# Patient Record
Sex: Male | Born: 1975
Health system: Southern US, Community
[De-identification: ages and names within clinical notes are randomized; demographics above are authoritative.]

## PROBLEM LIST (undated history)

## (undated) DIAGNOSIS — T7840XA Allergy, unspecified, initial encounter: Secondary | ICD-10-CM

## (undated) DIAGNOSIS — C4361 Malignant melanoma of right upper limb, including shoulder: Secondary | ICD-10-CM

## (undated) DIAGNOSIS — K219 Gastro-esophageal reflux disease without esophagitis: Secondary | ICD-10-CM

## (undated) HISTORY — DX: Gastro-esophageal reflux disease without esophagitis: K21.9

## (undated) HISTORY — DX: Malignant melanoma of right upper limb, including shoulder: C43.61

## (undated) HISTORY — DX: Allergy, unspecified, initial encounter: T78.40XA

---

## 2009-07-24 ENCOUNTER — Ambulatory Visit: Payer: Self-pay | Admitting: Internal Medicine

## 2009-07-24 DIAGNOSIS — C439 Malignant melanoma of skin, unspecified: Secondary | ICD-10-CM | POA: Insufficient documentation

## 2009-07-24 DIAGNOSIS — R252 Cramp and spasm: Secondary | ICD-10-CM

## 2009-07-24 DIAGNOSIS — J309 Allergic rhinitis, unspecified: Secondary | ICD-10-CM | POA: Insufficient documentation

## 2009-07-24 DIAGNOSIS — M25569 Pain in unspecified knee: Secondary | ICD-10-CM

## 2009-07-24 DIAGNOSIS — K219 Gastro-esophageal reflux disease without esophagitis: Secondary | ICD-10-CM | POA: Insufficient documentation

## 2009-08-08 ENCOUNTER — Encounter: Payer: Self-pay | Admitting: Internal Medicine

## 2010-04-11 ENCOUNTER — Other Ambulatory Visit: Payer: Self-pay | Admitting: Endocrinology

## 2010-04-11 ENCOUNTER — Ambulatory Visit
Admission: RE | Admit: 2010-04-11 | Discharge: 2010-04-11 | Payer: Self-pay | Source: Home / Self Care | Attending: Endocrinology | Admitting: Endocrinology

## 2010-04-11 DIAGNOSIS — R61 Generalized hyperhidrosis: Secondary | ICD-10-CM | POA: Insufficient documentation

## 2010-04-11 DIAGNOSIS — N498 Inflammatory disorders of other specified male genital organs: Secondary | ICD-10-CM | POA: Insufficient documentation

## 2010-04-11 LAB — LIPID PANEL
Cholesterol: 177 mg/dL (ref 0–200)
HDL: 40.4 mg/dL (ref >39.00)
LDL Cholesterol: 112 mg/dL — ABNORMAL HIGH (ref 0–99)
Total CHOL/HDL Ratio: 4
Triglycerides: 125 mg/dL (ref 0.0–149.0)
VLDL: 25 mg/dL (ref 0.0–40.0)

## 2010-04-11 LAB — CBC WITH DIFFERENTIAL/PLATELET
Basophils Absolute: 0 10*3/uL (ref 0.0–0.1)
Basophils Relative: 0.5 % (ref 0.0–3.0)
Eosinophils Absolute: 0.1 10*3/uL (ref 0.0–0.7)
Eosinophils Relative: 1.4 % (ref 0.0–5.0)
HCT: 44.9 % (ref 39.0–52.0)
Hemoglobin: 15.8 g/dL (ref 13.0–17.0)
Lymphocytes Relative: 20.6 % (ref 12.0–46.0)
Lymphs Abs: 1.2 10*3/uL (ref 0.7–4.0)
MCHC: 35.1 g/dL (ref 30.0–36.0)
MCV: 89.6 fl (ref 78.0–100.0)
Monocytes Absolute: 0.6 10*3/uL (ref 0.1–1.0)
Monocytes Relative: 9.6 % (ref 3.0–12.0)
Neutro Abs: 4 10*3/uL (ref 1.4–7.7)
Neutrophils Relative %: 67.9 % (ref 43.0–77.0)
Platelets: 321 10*3/uL (ref 150.0–400.0)
RBC: 5.01 Mil/uL (ref 4.22–5.81)
RDW: 13.1 % (ref 11.5–14.6)
WBC: 5.9 10*3/uL (ref 4.5–10.5)

## 2010-04-11 LAB — BASIC METABOLIC PANEL
BUN: 15 mg/dL (ref 6–23)
CO2: 29 mEq/L (ref 19–32)
Calcium: 9.6 mg/dL (ref 8.4–10.5)
Chloride: 102 mEq/L (ref 96–112)
Creatinine, Ser: 0.9 mg/dL (ref 0.4–1.5)
GFR: 106.56 mL/min (ref >60.00)
Glucose, Bld: 77 mg/dL (ref 70–99)
Potassium: 4.3 mEq/L (ref 3.5–5.1)
Sodium: 137 mEq/L (ref 135–145)

## 2010-04-11 LAB — HEPATIC FUNCTION PANEL
ALT: 35 U/L (ref 0–53)
AST: 26 U/L (ref 0–37)
Albumin: 4 g/dL (ref 3.5–5.2)
Alkaline Phosphatase: 64 U/L (ref 39–117)
Bilirubin, Direct: 0 mg/dL (ref 0.0–0.3)
Total Bilirubin: 0.8 mg/dL (ref 0.3–1.2)
Total Protein: 7.4 g/dL (ref 6.0–8.3)

## 2010-04-11 LAB — TSH: TSH: 1.52 u[IU]/mL (ref 0.35–5.50)

## 2010-04-15 ENCOUNTER — Encounter
Admission: RE | Admit: 2010-04-15 | Discharge: 2010-04-15 | Payer: Self-pay | Source: Home / Self Care | Attending: Endocrinology | Admitting: Endocrinology

## 2010-04-30 NOTE — Assessment & Plan Note (Signed)
Summary: NEW / [facility] Natale Milch  #   Vital Signs:  Patient profile:   35 year old male Height:      68 inches Weight:      181.50 pounds BMI:     27.70 O2 Sat:      97 % on Room air Temp:     98.3 degrees F oral Pulse rate:   81 / minute BP sitting:   108 / 74  (left arm) Cuff size:   regular  Vitals Entered ByZella Ball Ewing (July 24, 2009 10:21 AM)  O2 Flow:  Room air CC: New Pt. New [employer]/RE   CC:  New Pt. New [employer]/RE.  History of Present Illness: here with pain to the upper abd area, intermittnet for several years, used to be worse with putting down flooring , and more recetnly with putting the child in the car seat - has marked cramp  - better with sitting up very straight ad seems to help the cramp dissipate;  has some mild reflux in the past but this is different, no n/v, bowel change, blood, fever,.   also due for melanoma f/u  also here with right knee pain still persistent after playing football in Feb 2011 where a player jumpedon his back to tackle him and he suffered a swollen painful right knee;  not sure if he twisted it;  has had peristent pain since then although improving, but still wtih signficant mild decreased ROM on full extension and can no longer take 2 steps at a time going up stairs due to pain;  no falls or other injury, no fever, wt loss, or hx of knee problem in the past.    Preventive Screening-Counseling & Management  Alcohol-Tobacco     Smoking Status: quit      Drug Use:  no.    Problems Prior to Update: 1)  Knee Pain, Right  (ICD-719.46) 2)  Muscle Cramps  (ICD-729.82) 3)  Melanoma  (ICD-172.9) 4)  Gerd  (ICD-530.81) 5)  Allergic Rhinitis  (ICD-477.9)  Medications Prior to Update: 1)  None  Current Medications (verified): 1)  Fexofenadine Hcl 180 Mg Tabs (Fexofenadine Hcl) .Marland Kitchen.. 1 By Mouth Once Daily As Needed  Allergies (verified): No Known Drug Allergies  Past History:  Family History: Last updated: 07/24/2009 father with COPD,  HTN m-cousin and p-grandfather died with melanoma  Social History: Last updated: 07/24/2009 Married 1 child work - Holiday representative - [employer] Former Smoker Alcohol use-yes Drug use-no  Risk Factors: Smoking Status: quit (07/24/2009)  Past Medical History: Allergic rhinitis GERD hx of melanoma right arm 2010 - Dr Edmon Crape  Past Surgical History: Denies surgical history  Family History: Reviewed history and no changes required. father with COPD, HTN m-cousin and p-grandfather died with melanoma  Social History: Reviewed history and no changes required. Married 1 child work - Aeronautical engineer Former Smoker Alcohol use-yes Drug use-no Smoking Status:  quit Drug Use:  no  Review of Systems  The patient denies anorexia, fever, weight loss, weight gain, vision loss, decreased hearing, hoarseness, chest pain, syncope, dyspnea on exertion, peripheral edema, prolonged cough, headaches, hemoptysis, abdominal pain, melena, hematochezia, severe indigestion/heartburn, hematuria, muscle weakness, difficulty walking, depression, unusual weight change, abnormal bleeding, enlarged lymph nodes, and angioedema.         all otherwise negative per pt -    Physical Exam  General:  alert and well-developed.   Head:  normocephalic and atraumatic.   Eyes:  vision grossly  intact, pupils equal, and pupils round.   Ears:  R ear normal and L ear normal.   Nose:  no external deformity and no nasal discharge.   Mouth:  no gingival abnormalities and pharynx pink and moist.   Neck:  supple and no masses.   Lungs:  normal respiratory effort and normal breath sounds.   Heart:  normal rate and regular rhythm.   Abdomen:  soft, non-tender, and normal bowel sounds.   Msk:  no joint tenderness and no joint swelling.  , right knee without crepitus or effusion but slihgt decreased full extension, no pain on extension Extremities:  no edema, no erythema  Neurologic:  cranial nerves II-XII intact  and strength normal in all extremities.     Impression & Recommendations:  Problem # 1:  MUSCLE CRAMPS (ICD-729.82) to upper abd  only - due to exertion, MSK etiology - exam today benign, d/w pt - educated, reassured, ok to follow for now, pt declines labs such as ca, mg, K today  Problem # 2:  MELANOMA (ICD-172.9) exam benign today but needs yearly f/u - to refer to derm Orders: Dermatology Referral (Derma)  Problem # 3:  ALLERGIC RHINITIS (ICD-477.9)  His updated medication list for this problem includes:    Fexofenadine Hcl 180 Mg Tabs (Fexofenadine hcl) .Marland Kitchen... 1 by mouth once daily as needed treat as above, f/u any worsening signs or symptoms , hopefully more effective than claritin;  declines flonase at this time  Problem # 4:  KNEE PAIN, RIGHT (ICD-719.46) mild subjective, exam shows decreased ROM and decreased mobility - to refer to ortho for further eval - ? ACL strain improving, ? needs PT Orders: Orthopedic Surgeon Referral (Ortho Surgeon)  Complete Medication List: 1)  Fexofenadine Hcl 180 Mg Tabs (Fexofenadine hcl) .Marland Kitchen.. 1 by mouth once daily as needed  Patient Instructions: 1)  You will be contacted about the referral(s) to: dermatologist , and the orthopedic 2)  stop the claritin 3)  start the generic allegra instead 4)  call if you feel you need the generic flonase nasal spary as well 5)  Please schedule a follow-up appointment as needed. Prescriptions: FEXOFENADINE HCL 180 MG TABS (FEXOFENADINE HCL) 1 by mouth once daily as needed  #30 x 11   Entered and Authorized by:   Corwin Levins MD   Signed by:   Corwin Levins MD on 07/24/2009   Method used:   Print then Give to Patient   RxID:   [id number]

## 2010-04-30 NOTE — Letter (Signed)
Summary: [facility]  [facility]   Imported By: Sherian Rein 08/24/2009 12:11:26  _____________________________________________________________________  External Attachment:    Type:   Image     Comment:   External Document

## 2010-05-02 NOTE — Assessment & Plan Note (Signed)
Summary: LUMP ON TESTICLE/NWS   Vital Signs:  Patient profile:   35 year old male Height:      68 inches (172.72 cm) Weight:      179.75 pounds (81.70 kg) BMI:     27.43 O2 Sat:      97 % on Room air Temp:     98.5 degrees F (36.94 degrees C) oral Pulse rate:   68 / minute Pulse rhythm:   regular BP sitting:   118 / 82  (left arm) Cuff size:   regular  Vitals Entered By: Brenton Grills CMA Duncan Dull) (April 11, 2010 9:02 AM)  O2 Flow:  Room air CC: Lump on L testicle/aj Is Patient Diabetic? No Comments Pt is no longer taking Fexofenadine   CC:  Lump on L testicle/aj.  History of Present Illness: 2 days od slight nodule at the left testicle.  no assoc pain.  no urologic hx. pt states few weeks of night sweats.    Current Medications (verified): 1)  Fexofenadine Hcl 180 Mg Tabs (Fexofenadine Hcl) .Marland Kitchen.. 1 By Mouth Once Daily As Needed  Allergies (verified): No Known Drug Allergies  Past History:  Past Medical History: Last updated: 07/24/2009 Allergic rhinitis GERD hx of melanoma right arm 2010 - Dr Raliegh Ip Derm  Review of Systems  The patient denies fever, weight loss, and weight gain.         denies dysuria  Physical Exam  General:  Well developed, well nourished, in no acute distress.  Genitalia:  Normal external male genitalia, except for 2 mm firm nodule at the left testicle, with no urethral discharge.  Additional Exam:  FastTSH                   1.52 uIU/mL                 0.35-5.50    White Cell Count          5.9 K/uL                    4.5-10.5   Red Cell Count            5.01 Mil/uL                 4.22-5.81   Hemoglobin                15.8 g/dL                   29.5-18.8   Hematocrit                44.9 %                      39.0-52.0   MCV                       89.6 fl                     78.0-100.0   MCHC                      35.1 g/dL                   41.6-60.6   RDW                       13.1 %  11.5-14.6  Platelet Count            321.0 K/uL      LDL Cholesterol      [H]  161 mg/dL      Impression & Recommendations:  Problem # 1:  OTHER INFLAMMATORY DISORDER MALE GENITAL ORGANS (ICD-608.4) Assessment New uncertain etiology  Problem # 2:  NIGHT SWEATS (ICD-780.8) uncertain etiology  Problem # 3:  dyslipidemia mild  Other Orders: Radiology Referral (Radiology) TLB-TSH (Thyroid Stimulating Hormone) (84443-TSH) TLB-CBC Platelet - w/Differential (85025-CBCD) TLB-Lipid Panel (80061-LIPID) TLB-BMP (Basic Metabolic Panel-BMET) (80048-METABOL) TLB-Hepatic/Liver Function Pnl (80076-HEPATIC) Est. Patient Level IV (09604)  Patient Instructions: 1)  check ultrasound of the left testicle.  you will be called with a day and time for an appointment.  then please call [phone] to hear your test results. 2)  blood tests are being ordered for you today.  again, please call [phone] to hear your test results. 3)  (update: i left message on phone-tree:  rx as we discussed.  some drs advise chol med for this ldl)   Orders Added: 1)  Radiology Referral [Radiology] 2)  TLB-TSH (Thyroid Stimulating Hormone) [84443-TSH] 3)  TLB-CBC Platelet - w/Differential [85025-CBCD] 4)  TLB-Lipid Panel [80061-LIPID] 5)  TLB-BMP (Basic Metabolic Panel-BMET) [80048-METABOL] 6)  TLB-Hepatic/Liver Function Pnl [80076-HEPATIC] 7)  Est. Patient Level IV [56213]

## 2014-01-02 ENCOUNTER — Ambulatory Visit (INDEPENDENT_AMBULATORY_CARE_PROVIDER_SITE_OTHER): Payer: 59 | Admitting: Family

## 2014-01-02 ENCOUNTER — Encounter: Payer: Self-pay | Admitting: Family

## 2014-01-02 VITALS — BP 130/90 | HR 72 | Temp 98.4°F | Ht 68.0 in | Wt 172.4 lb

## 2014-01-02 DIAGNOSIS — J309 Allergic rhinitis, unspecified: Secondary | ICD-10-CM

## 2014-01-02 DIAGNOSIS — K219 Gastro-esophageal reflux disease without esophagitis: Secondary | ICD-10-CM

## 2014-01-02 NOTE — Progress Notes (Signed)
Subjective:    Patient ID: Taylor Diaz, male    DOB: 1976-02-20, 38 y.o.   MRN: [id number]  HPI:  Taylor Diaz is a 38 y.o. male who presents today for to establish care. He was previously followed by a physician in the [place] office.  1) Heartburn - feeling it since last week which has been intermittent; Felt mainly in RUQ after a big meal or when constipated. Improves with bowel movements. Described as sharp pains like a "charlie horse"; denies coughs at night or burning sensation. Relieved by TUMS.    2) Left wrist - last Thursday night. Noted point on dorsal aspect of left wrist. Indicates it feels sharp. Denies trauma, tenderness, deformity or discoloration. Concerned with previous history of melanoma.   3) Rhinitis - denies fevers, chills, nightsweats. Wife previously had the flu. Indicates that he was sick for about a month with a cough, which has since resolved. Continues to have stuffiness every now and then.    Health Maintenance: Dental -- up to date Vision -- never - going to seek independently, will refer if needed.  Immunizations -- offered and declined flu shot. Believes last Tetanus was in 2010. Is going to check on this to update.   No Known Allergies Family History  Problem Relation Age of Onset  . COPD Father   . Hypertension Father    History   Social History  . Marital Status: Married    Spouse Name: Janett Billow    Number of Children: 2  . Years of Education: 12   Occupational History  .  [employer]   Social History Main Topics  . Smoking status: Former Smoker -- 1.00 packs/day for 5 years    Types: Cigarettes    Quit date: 01/02/2006  . Smokeless tobacco: Never Used  . Alcohol Use: Yes     Comment: A couple of drinks per week  . Drug Use: No  . Sexual Activity: Yes    Partners: Female   Other Topics Concern  . Not on file   Social History Narrative   [place], [place] moved when he was 73   Married - 2 daugter 5, son 3   Works at [employer] to [employer].    Christian   Review of Systems  General: Denies fever, chills, fatigue, or significant weight gain/loss. Skin: Denies rashes, lumps, itching, dryness, color changes, or hair/nail changes HENT:  Head: Denies headache or neck pain  Ears: Denies changes in hearing, ringing in ears, earache, drainage  Eyes: Denies loss/changes in vision, pain, redness, blurry/double vision, flashing  lights  Nose:Positive for occasional congestion.  Denies discharge, itching, nosebleed, sinus pain  Throat: Denies sore throat, hoarseness, dry mouth, sores, thrush Neck: Denies lumps, swollen glands, stiffness Breasts: Denies lumps, pain, discharge Respiratory: Denies shortness of breath, cough, sputum production, wheezing Cardiovascular: Denies chest pain/discomfort, tightness, palpitations, shortness of breath with activity, difficulty lying down, swelling, sudden awakening with shortness of breath Gastrointestinal: Positive for heartburn and charlie horse feeling as described in HPI. Denies dysphasia, change in appetite, nausea, change in bowel habits, rectal bleeding, constipation, diarrhea, yellow skin or eyes Urinary: Denies frequency, urgency, burning/pain, blood in urine, incontinence, change in urinary strength. Vascular: Denies calf pain with walking and leg cramping. Musculoskeletal: Positive for left wrist as described in HPI. Denies muscle/joint pain, stiffness, back pain, redness or swelling of joints, trauma Neurological: Denies dizziness, fainting, seizures, weakness, numbness, tingling, tremor  Hematologic - Denies ease of bruising or bleeding Endocrine - Denies heat or cold intolerance, sweating, frequent urination, excessive thirst, changes in appetite Psychiatric - Denies nervousness, stress, depression or memory loss.      Objective:     BP 130/90  Pulse 72  Temp(Src) 98.4 F (36.9 C) (Oral)  Ht 5\' 8"  (1.727 m)  Wt  172 lb 6.4 oz (78.2 kg)  BMI 26.22 kg/m2  SpO2 97% Nursing note and vital signs reviewed.  Physical Exam  Constitutional: He is oriented to person, place, and time. He appears well-developed and well-nourished. No distress.  HENT:  Head: Normocephalic.  Neck: Neck supple.  Cardiovascular: Normal rate, regular rhythm, normal heart sounds and intact distal pulses.   Pulmonary/Chest: Effort normal and breath sounds normal.  Abdominal: He exhibits no distension and no mass. There is no rebound and no guarding.  Flat, bowel sounds present in all quadrants. Percussion is appropriate. Mild discomfort RUQ. Liver span is appropriate, spleen not palpable.   Musculoskeletal:  Left wrist with no edema, deformity or discoloration. Full range of motion is present bilaterally.  Neurological: He is alert and oriented to person, place, and time.  Skin: Skin is warm and dry.  Psychiatric: He has a normal mood and affect. His behavior is normal. Judgment and thought content normal.      Assessment & Plan:

## 2014-01-02 NOTE — Assessment & Plan Note (Signed)
Currently stable. Be sure to drink plenty of fluids. Consider OTC antihistamine like Zyrtec or Allegra. Start Flonase to help with inflammation.

## 2014-01-02 NOTE — Progress Notes (Signed)
Pre visit review using our clinic review tool, if applicable. No additional management support is needed unless otherwise documented below in the visit note. 

## 2014-01-02 NOTE — Patient Instructions (Signed)
Thank you for choosing [facility].  Summary/Instructions:   Please keep a log of your stomach pain until your physical. Try a probiotic like Activia. May also try Prilosec or Zantac as needed.  For you nasal congestion recommend plenty of fluids, and OTC antihistamine as needed, and consider starting Flonase.  Please schedule an appointment in about a month for your physical and blood work.   Thank you for enrolling in [place]. Please follow the instructions below to securely access your online medical record. MyChart allows you to send messages to your doctor, view your test results, renew your prescriptions, schedule appointments, and more.  How Do I Sign Up? 1. In your Internet browser, go to [email or web address]. 2. Click on the New  User? link in the Sign In box.  3. Enter your MyChart Access Code exactly as it appears below. You will not need to use this code after you have completed the sign-up process. If you do not sign up before the expiration date, you must request a new code. MyChart Access Code: [email or web address] Expires: 03/03/2014 11:01 AM  4. Enter the last four digits of your Social Security Number (xxxx) and Date of Birth (mm/dd/yyyy) as indicated and click Next. You will be taken to the next sign-up page. 5. Create a MyChart ID. This will be your MyChart login ID and cannot be changed, so think of one that is secure and easy to remember. 6. Create a MyChart password. You can change your password at any time. 7. Enter your Password Reset Question and Answer and click Next. This can be used at a later time if you forget your password.  8. Select your communication preference, and if applicable enter your e-mail address. You will receive e-mail notification when new information is available in MyChart by choosing to receive e-mail notifications and filling in your e-mail. 9. Click Sign In. You can now view your medical record.   Additional  Information If you have questions, you can email [email or web address] or call [phone] to talk to our [facility] staff. Remember, MyChart is NOT to be used for urgent needs. For medical emergencies, dial 911.

## 2014-01-02 NOTE — Assessment & Plan Note (Addendum)
Continue TUMS as needed. Start probiotic and an OTC Zantac or Nexium. Keep log of the occurences. If no improvement will obtain imaging or refer to GI.

## 2014-10-31 ENCOUNTER — Ambulatory Visit: Payer: Self-pay

## 2014-11-08 ENCOUNTER — Encounter: Payer: Self-pay | Admitting: Physical Therapy

## 2014-11-08 ENCOUNTER — Ambulatory Visit: Payer: 59 | Attending: Orthopedic Surgery | Admitting: Physical Therapy

## 2014-11-08 DIAGNOSIS — M62830 Muscle spasm of back: Secondary | ICD-10-CM | POA: Diagnosis present

## 2014-11-08 DIAGNOSIS — M5417 Radiculopathy, lumbosacral region: Secondary | ICD-10-CM | POA: Insufficient documentation

## 2014-11-08 DIAGNOSIS — M5416 Radiculopathy, lumbar region: Secondary | ICD-10-CM

## 2014-11-09 NOTE — Therapy (Signed)
[facility] [address], [place], [address] Phone: [phone]   Fax:  [phone]  Physical Therapy Evaluation  Patient Details  Name: Taylor Diaz MRN: [id number] Date of Birth: 1976-02-03 Referring Provider:  Reche Dixon, PA-C  Encounter Date: 11/08/2014      PT End of Session - 11/08/14 1130    Visit Number 1   Number of Visits 8   Date for PT Re-Evaluation 12/07/14   PT Start Time 1020   PT Stop Time 1125   PT Time Calculation (min) 65 min   Activity Tolerance Patient tolerated treatment well   Behavior During Therapy [facility] for tasks assessed/performed      Past Medical History  Diagnosis Date  . Allergy   . Malignant melanoma of right forearm     Followed by Dermaotology - Dx 05/2008  . GERD (gastroesophageal reflux disease)     History reviewed. No pertinent past surgical history.  There were no vitals filed for this visit.  Visit Diagnosis:  Lumbar back pain with radiculopathy affecting left lower extremity - Plan: PT plan of care cert/re-cert  Spasm of muscle, back - Plan: PT plan of care cert/re-cert      Subjective Assessment - 11/08/14 1036    Subjective Patient reports he is having pain in lower back left>right side that comes and goes. He has radiation down left LE to outside of foot (L5-S1 distribution)   Pertinent History Patient reports he began having this episode of back pain since 06/2014: He was doing yard work and felt a pop and initially the pain was in his back and then began to radiate down leg.    Patient Stated Goals Patient would like to be able to have no pain and be able to return to prior level of activity without difficulty and pain: mowing yard with push mower, helping with carrying items up/down 3 flights of stairs, playing with children (43 and 39 years old)             [facility] PT Assessment - 11/08/14 2254    Assessment   Medical Diagnosis lumbar  radiculopathy left   Onset Date/Surgical Date 07/30/14   Hand Dominance Right   Next MD Visit unknown   Prior Therapy none   Precautions   Precautions None   Restrictions   Weight Bearing Restrictions No   Balance Screen   Has the patient fallen in the past 6 months No   Has the patient had a decrease in activity level because of a fear of falling?  No   Is the patient reluctant to leave their home because of a fear of falling?  No      Objective: AROM: lumbar spine: WFL's for flexion with increased lower back strain felt across both sides, limited in hamstring flexibility, lumbar extension: WFL's with improved pain level and less strain felt in lower back with repetition, lateral flexion equal bilaterally without increased pain.  Flexibility: hamstring flexibility decreased right and left (SLR to 70 degrees) AROM and strength: both LE's hip flexion WNL's, knee extension/flexion WNL's and ankle DF/EHL Special testing: FABERS negative both hips, SIJ compression negative for reproduction of symptoms, SLR negative bilaterally, crossed SLR negative bilaterally, slump testing mildy (+) left LE with pressure and pain reproduced in left lower back into hip/buttock Palpation: + spasms palpable along lower back left side L3-5 region Gait: WNL's Treatment:  Instructed in prone progression over one pillow, ice pack applied to  lower back x 10 min. During prone lying, prop on elbows x 2-3 min. Then standing back extension performed x 5 reps. Instructed in proper body mechanics for daily common activities, instructed in side lying clam exercise and performed with verbal cuing       PT Education - 11/08/14 1120    Education provided Yes   Education Details Educated patient in proper positioning for sitting with lumbar roll and good sitting posture, common activities body mechanics, lifing with correct techniques including golfer's bend. Exercises for home for positioning prone progression and  standing extension and side lying clam    Person(s) Educated Patient   Methods Explanation;Demonstration;Verbal cues;Handout   Comprehension Verbalized understanding;Returned demonstration;Verbal cues required             PT Long Term Goals - 11/08/14 1130    PT LONG TERM GOAL #1   Title Patient will demonstrate improve pain level and function with less difficulty with modified oswestry score of 20% or less by 12/07/2014   Baseline 34%    Status New   PT LONG TERM GOAL #2   Title Patient will demonstrate good knowledge of proper body mechanics for sitting, getting in and out of bed, car and proper lifting technique to decrease strain on back without verbal cuing by 12/07/14   Baseline patient has no knowledge of appropriate body mechanics for sitting, standing and moving or lifting   Status New   PT LONG TERM GOAL #3   Title Patient will be independent with self management of pain and progressive exercise to imrpove core control/strength for return to prior level of function without cuing by 12/07/14   Status New           Plan - 11/08/14 1130    Clinical Impression Statement Patient is a 39 year old male who presents with lumbar pain with raduculopathy that began 06/2014. He responded well with prone progression and verbalized understanding of home program for pain control and exercise. He has limited mobility in hamstrings, (+) spasms in lower back and decreased posture awareness and will benefit from physical therapy intervention to control pain, reduce spasms in order to return to prior level of function without pain.    Pt will benefit from skilled therapeutic intervention in order to improve on the following deficits Pain;Decreased range of motion;Impaired flexibility;Increased muscle spasms   Rehab Potential Good   PT Frequency 2x / week   PT Duration 4 weeks   PT Treatment/Interventions Manual techniques;Electrical Stimulation;Therapeutic exercise;Moist  Heat;Ultrasound;Patient/family education;Dry needling;Cryotherapy   PT Next Visit Plan prone progression, manual techniques STM, exercise progression, core control exercises   PT Home Exercise Plan improve body mechanics for daily activities, prone progression exercises, side lying clam exercise, use of ice    Consulted and Agree with Plan of Care Patient         Problem List Patient Active Problem List   Diagnosis Date Noted  . 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 NIGHT SWEATS 04/11/2010  . MELANOMA 07/24/2009  . ALLERGIC RHINITIS 07/24/2009  . GERD 07/24/2009  . KNEE PAIN, RIGHT 07/24/2009  . MUSCLE CRAMPS 07/24/2009    Jomarie Longs PT 11/09/2014, 2:32 PM   [facility] [address] [place], [address] Phone: [phone]   Fax:  [phone]

## 2014-11-10 ENCOUNTER — Ambulatory Visit: Payer: 59 | Admitting: Physical Therapy

## 2014-11-10 ENCOUNTER — Encounter: Payer: Self-pay | Admitting: Physical Therapy

## 2014-11-10 DIAGNOSIS — M5417 Radiculopathy, lumbosacral region: Secondary | ICD-10-CM | POA: Diagnosis not present

## 2014-11-10 DIAGNOSIS — M5416 Radiculopathy, lumbar region: Secondary | ICD-10-CM

## 2014-11-10 DIAGNOSIS — M62830 Muscle spasm of back: Secondary | ICD-10-CM

## 2014-11-10 NOTE — Therapy (Signed)
[facility] [address], [place], [address] Phone: [phone]   Fax:  [phone]  Physical Therapy Treatment  Patient Details  Name: Taylor Diaz MRN: [id number] Date of Birth: Aug 18, 1975 Referring Provider:  Reche Dixon, PA-C  Encounter Date: 11/10/2014      PT End of Session - 11/10/14 1020    Visit Number 2   Number of Visits 8   Date for PT Re-Evaluation 12/07/14   PT Start Time 0920   PT Stop Time 1020   PT Time Calculation (min) 60 min   Activity Tolerance Patient tolerated treatment well   Behavior During Therapy [facility] for tasks assessed/performed      Past Medical History  Diagnosis Date  . Allergy   . Malignant melanoma of right forearm     Followed by Dermaotology - Dx 05/2008  . GERD (gastroesophageal reflux disease)     History reviewed. No pertinent past surgical history.  There were no vitals filed for this visit.  Visit Diagnosis:  Lumbar back pain with radiculopathy affecting left lower extremity  Spasm of muscle, back      Subjective Assessment - 11/10/14 0919    Subjective Patient reports he has not had any foot symptoms for a couple of days and he is still having dull aching with pressure like feeling in his lower back .    Patient Stated Goals Patient would like to be able to have no pain and be able to return to prior level of activity without difficulty and pain.       Objective: Reassessed Slump test: mild + on left, decreased from previous session per patient report Palpation: left side lumbar spine and L4-5 over spine with spasms and point tenderness PA mobility decreased throughout lumbar spine with tenderness over L3-5 region with PA pressure        [facility] Adult PT Treatment/Exercise - 11/10/14 1016    Exercises   Exercises Other Exercises   Other Exercises  Standing back extension with verbal cuing to perform through less ROM to avoid pain, side lying  flexion rotation to decrease left sided lower back pain, 3 reps with overpressure: increased left sided pressure, no worse following technique, prone progression with addition of press ups within pain free ROM and instruction to perform in a relaxed position   Modalities   Modalities Electrical Stimulation;Cryotherapy;Ultrasound   Cryotherapy   Number Minutes Cryotherapy 20 Minutes   Cryotherapy Location Lumbar Spine   Type of Cryotherapy Ice pack   Electrical Stimulation   Electrical Stimulation Location bilateral lumbar spine paraspinal muscles patient prone over 1 pillow   Electrical Stimulation Parameters high volt estim. applied (4) electrodes to bilateral aspect of lumbar spine x 20 min.    Electrical Stimulation Goals Pain   Ultrasound   Ultrasound Location lumbar paraspinal muscles left    Ultrasound Parameters 1MHz 50% pulsed 1.4w/cm2 x 10 min. with patient prone lying over one pillow   Ultrasound Goals Pain   Manual Therapy   Manual Therapy Joint mobilization;Soft tissue mobilization   Manual therapy comments (+) spasms palpable along left side lower thoracic through lower lumbar spine, joint mobility decreased throughout lumbar spine and painful at L4-5 region   Joint Mobilization PA mobilizations at T12 - L5 gentle mobilization grade2 x 3 sets, 10bouts each level as tolerated, no change in pain/pressure   Soft tissue mobilization STM along paraspinal muscles lower thoracic through lumbar spine with patient prone lying followed by  [place] and [place].       Patient response to treatment: end of session reported no pressure felt in lower back on standing and walking, patient was able to demonstrate correct transfer technique (log rolling) on/off treatment table. He verbalized good understanding of proper body mechanics and correct exercise technique for prone progression following instruction and with verbal cuing to modify hand position (press ups) to decreases lower back symptoms            PT Education - 11/10/14 1020    Education provided Yes   Education Details educated in prone press ups, re addressed sitting posture in car with pillows under hips and lumbar roll to back, partial back extensions to exercise through pain free ROM   Person(s) Educated Patient   Methods Explanation;Verbal cues   Comprehension Verbalized understanding;Returned demonstration;Verbal cues required             PT Long Term Goals - 11/08/14 1130    PT LONG TERM GOAL #1   Title Patient will demonstrate imrpove pain level and function with less difficulty with modified oswestry score of 20% or less by 12/07/2014   Baseline 34%    Status New   PT LONG TERM GOAL #2   Title Patient will demonstrate good knowledge of proper body mechanics for sitting, getting in and out of bed, car and proper lifiting technique to decrase strain on back without verbal cuing by 12/07/14   Baseline patient has no knowledge of appropriate body mechanics for sitting, standing and moving or lifting   Status New   PT LONG TERM GOAL #3   Title Patient will be independent with self management of pain and progressive exercise to imrpove core control/strength for return to prior level of function without cuing by 12/07/14   Status New               Plan - 11/10/14 1030    Clinical Impression Statement Patient demonstrates imrpovement with centralization of pain towards lower back and no pain in lateral left foot x 2 days. He is learning to adjust posture for sitting, driving and daily activities. He is progressing with decreased spasms and improved mobility in lumbar spine with treatment.    Pt will benefit from skilled therapeutic intervention in order to improve on the following deficits Pain;Decreased range of motion;Impaired flexibility;Increased muscle spasms   Rehab Potential Good   PT Frequency 2x / week   PT Duration 4 weeks   PT Treatment/Interventions Manual techniques;Electrical  Stimulation;Therapeutic exercise;Moist Heat;Ultrasound;Patient/family education;Dry needling;Cryotherapy   PT Next Visit Plan prone progression, manual techniques STM, exercise progression, core control exercises   PT Home Exercise Plan improve body mechanics for daily activities, prone progression exercises, side lying clam exercise, use of ice         Problem List Patient Active Problem List   Diagnosis Date Noted  . 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 NIGHT SWEATS 04/11/2010  . MELANOMA 07/24/2009  . ALLERGIC RHINITIS 07/24/2009  . GERD 07/24/2009  . KNEE PAIN, RIGHT 07/24/2009  . MUSCLE CRAMPS 07/24/2009    Jomarie Longs PT 11/10/2014, 10:51 AM  [facility] [address], [place], [address] Phone: [phone]   Fax:  [phone]

## 2014-11-14 ENCOUNTER — Ambulatory Visit: Payer: 59 | Admitting: Physical Therapy

## 2014-11-14 ENCOUNTER — Encounter: Payer: Self-pay | Admitting: Physical Therapy

## 2014-11-14 DIAGNOSIS — M5416 Radiculopathy, lumbar region: Secondary | ICD-10-CM

## 2014-11-14 DIAGNOSIS — M5417 Radiculopathy, lumbosacral region: Secondary | ICD-10-CM | POA: Diagnosis not present

## 2014-11-14 DIAGNOSIS — M62830 Muscle spasm of back: Secondary | ICD-10-CM

## 2014-11-15 NOTE — Therapy (Signed)
[facility] [address] [place], [address] Phone: [phone]   Fax:  [phone]  Physical Therapy Treatment  Patient Details  Name: Taylor Diaz MRN: [id number] Date of Birth: 05/03/1975 Referring Provider:  Reche Dixon, PA-C  Encounter Date: 11/14/2014      PT End of Session - 11/14/14 1145    Visit Number 3   Number of Visits 8   Date for PT Re-Evaluation 12/07/14   PT Start Time 4431   PT Stop Time 1138   PT Time Calculation (min) 58 min   Activity Tolerance Patient tolerated treatment well   Behavior During Therapy [facility] for tasks assessed/performed      Past Medical History  Diagnosis Date  . Allergy   . Malignant melanoma of right forearm     Followed by Dermaotology - Dx 05/2008  . GERD (gastroesophageal reflux disease)     History reviewed. No pertinent past surgical history.  There were no vitals filed for this visit.  Visit Diagnosis:  Lumbar back pain with radiculopathy affecting left lower extremity  Spasm of muscle, back      Subjective Assessment - 11/14/14 1046    Subjective Patient reports his symptoms have centralized to his lower back bilaterally, left side > right side and more central. He did have increased back spasms in mid back over the weekend. He feels he is progressing with less back symptoms and symptoms are described as pressure feeling.    Patient Stated Goals Patient would like to be able to have no pain and be able to return to prior level of activity without difficulty and pain.    Currently in Pain? Yes   Pain Score 3    Pain Location Back   Pain Orientation Lower   Pain Descriptors / Indicators Aching   Pain Onset More than a month ago   Multiple Pain Sites No     Objective: Standing posture: pelvis equal height iliac crests, + spasms left side lower thoracic spine Palpation: L4-5 left with + spasms /tenderness palpable         [facility] Adult PT  Treatment/Exercise - 11/14/14 1048    Exercises   Exercises Other Exercises   Other Exercises  Standing back extension with verbal cuing to perform through less ROM to avoid pain,  prone progression with  press ups within pain free ROM and instruction to perform in a relaxed position   Modalities   Modalities Electrical Stimulation;Cryotherapy;Ultrasound   Cryotherapy   Cryotherapy Location Lumbar Spine x 20 min in conjunction with estim. With patient prone lying   Electrical Stimulation   Electrical Stimulation Location bilateral lumbar spine paraspinal muscles patient prone over 1 pillow High volt estim. Continuous mode (muscle spasms) with electrodes applied to bilateral lower thoracic spine and bilateral lower lumbar spine   Electrical Stimulation Goals Pain   Ultrasound   Ultrasound Location lumbar paraspinal muscels left side with patient prone over one pillow 3MHZ 0.8w/cm2 pulsed @20 % to left side L5 paraspinal region, 1MHz 50% pulsed 1.4w/cm2 x 10 min. To left side thoracic spine paraspinal muscles   Ultrasound Goals Pain, muscle spasms   Manual Therapy   Manual Therapy Joint mobilization;Soft tissue mobilization   Manual therapy comments (+) spasms palpable along left side lower thoracic through lower lumbar spine, joint mobility decreased throughout lumbar spine and painful at L4-5 region   Joint Mobilization PA mobilizations at T12 - L5 gentle mobilization grade2 x 3 sets, 10bouts  each level as tolerated, decreased tenderness/pain as compared to prior session   Soft tissue mobilization STM along paraspinal muscles lower thoracic spine through lumbar spine with patient prone lying     Patient response to treatment: improved joint mobility along thoracic to lumbar spine, continued with increased tenderness L4-5 region, decreased to very mild following [place] treatment and further decreased following estim. Treatment. Patient required verbal cues to perform exercises with correct  technique, alignment of spine with relaxed posture, patient reported no pain following session and only mild pressure lower back once standing and walking            PT Education - 11/14/14 1135    Education provided Yes   Education Details reassessed home exercises for prone progression and proper body mechanics for common activities   Person(s) Educated Patient   Methods Explanation;Demonstration   Comprehension Verbalized understanding;Returned demonstration;Verbal cues required             PT Long Term Goals - 11/08/14 1130    PT LONG TERM GOAL #1   Title Patient will demonstrate imrpove pain level and function with less difficulty with modified oswestry score of 20% or less by 12/07/2014   Baseline 34%    Status New   PT LONG TERM GOAL #2   Title Patient will demonstrate good knowledge of proper body mechanics for sitting, getting in and out of bed, car and proper lifiting technique to decrase strain on back without verbal cuing by 12/07/14   Baseline patient has no knowledge of appropriate body mechanics for sitting, standing and moving or lifting   Status New   PT LONG TERM GOAL #3   Title Patient will be independent with self management of pain and progressive exercise to imrpove core control/strength for return to prior level of function without cuing by 12/07/14   Status New               Plan - 11/14/14 1145    Clinical Impression Statement Patient is progressing with decreased left LE symptoms and centralizaion of symptoms to left lower back. He requires PT intervention for pain control and to decrease spasms to allow progression of exercises in order for patient ro teturn to prior level of funciton.    Pt will benefit from skilled therapeutic intervention in order to improve on the following deficits Pain;Decreased range of motion;Impaired flexibility;Increased muscle spasms   Rehab Potential Good   PT Frequency 2x / week   PT Duration 4 weeks   PT  Treatment/Interventions Manual techniques;Electrical Stimulation;Therapeutic exercise;Moist Heat;Ultrasound;Patient/family education;Dry needling;Cryotherapy   PT Next Visit Plan prone progression, manual techniques STM, exercise progression, core control exercises        Problem List Patient Active Problem List   Diagnosis Date Noted  . 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 NIGHT SWEATS 04/11/2010  . MELANOMA 07/24/2009  . ALLERGIC RHINITIS 07/24/2009  . GERD 07/24/2009  . KNEE PAIN, RIGHT 07/24/2009  . MUSCLE CRAMPS 07/24/2009    Jomarie Longs PT 11/15/2014, 1:13 PM  [facility] [address] [place], [address] Phone: [phone]   Fax:  [phone]

## 2014-11-17 ENCOUNTER — Ambulatory Visit: Payer: 59 | Admitting: Physical Therapy

## 2014-11-21 ENCOUNTER — Ambulatory Visit: Payer: 59 | Admitting: Physical Therapy

## 2014-11-23 ENCOUNTER — Ambulatory Visit: Payer: 59 | Admitting: Physical Therapy

## 2014-11-28 ENCOUNTER — Ambulatory Visit: Payer: 59 | Admitting: Physical Therapy

## 2014-11-30 ENCOUNTER — Ambulatory Visit: Payer: 59 | Admitting: Physical Therapy

## 2014-12-05 ENCOUNTER — Ambulatory Visit: Payer: 59 | Attending: Orthopedic Surgery | Admitting: Physical Therapy

## 2014-12-07 ENCOUNTER — Ambulatory Visit: Payer: 59 | Admitting: Physical Therapy

## 2014-12-12 ENCOUNTER — Ambulatory Visit: Payer: 59 | Admitting: Physical Therapy

## 2014-12-19 ENCOUNTER — Encounter: Payer: 59 | Admitting: Physical Therapy

## 2014-12-21 ENCOUNTER — Encounter: Payer: 59 | Admitting: Physical Therapy

## 2014-12-28 ENCOUNTER — Encounter (HOSPITAL_COMMUNITY): Payer: Self-pay | Admitting: Emergency Medicine

## 2014-12-28 ENCOUNTER — Emergency Department (INDEPENDENT_AMBULATORY_CARE_PROVIDER_SITE_OTHER)
Admission: EM | Admit: 2014-12-28 | Discharge: 2014-12-28 | Disposition: A | Discharge status: Home / Self Care | Payer: 59 | Source: Home / Self Care | Attending: Family Medicine | Admitting: Family Medicine

## 2014-12-28 DIAGNOSIS — M542 Cervicalgia: Secondary | ICD-10-CM

## 2014-12-28 NOTE — ED Provider Notes (Signed)
CSN: [id number]     Arrival date & time 12/28/14  1416 History   First MD Initiated Contact with Patient 12/28/14 1453     Chief Complaint  Patient presents with  . Neck Pain   (Consider location/radiation/quality/duration/timing/severity/associated sxs/prior Treatment) HPI  Taylor Diaz is a 39 y.o. here today complaining of left sided neck pain that he describes as sharp and 8/10 and worse at night.  It is made worse by moving. This started 5 days ago.  He has tried ibuprofen and flexeril with some relief.  Denies upper extremity weakness, paraesthesia, and a change in dexterity.  Denies bowel or bladder changes.   Past Medical History  Diagnosis Date  . Allergy   . Malignant melanoma of right forearm     Followed by Dermaotology - Dx 05/2008  . GERD (gastroesophageal reflux disease)    History reviewed. No pertinent past surgical history. Family History  Problem Relation Age of Onset  . COPD Father   . Hypertension Father    Social History  Substance Use Topics  . Smoking status: Former Smoker -- 1.00 packs/day for 5 years    Types: Cigarettes    Quit date: 01/02/2006  . Smokeless tobacco: Never Used  . Alcohol Use: Yes     Comment: A couple of drinks per week    Review of Systems  Constitutional: Negative for fever and chills.  Respiratory: Negative for shortness of breath.   Cardiovascular: Negative for chest pain.  Genitourinary: Negative for difficulty urinating.  Musculoskeletal: Positive for myalgias and neck pain. Negative for back pain, joint swelling, arthralgias, gait problem and neck stiffness.  Neurological: Negative for dizziness, weakness, light-headedness, numbness and headaches.    Allergies  Review of patient's allergies indicates no known allergies.  Home Medications   Prior to Admission medications   Medication Sig Start Date End Date Taking? Authorizing Provider  buPROPion (WELLBUTRIN XL) 150 MG 24 hr tablet Take 150 mg by mouth daily.     Historical Provider, MD  cyclobenzaprine (FLEXERIL) 5 MG tablet Take 5 mg by mouth 3 (three) times daily as needed for muscle spasms.    Historical Provider, MD  traMADol (ULTRAM) 50 MG tablet Take by mouth every 6 (six) hours as needed.    Historical Provider, MD   Meds Ordered and Administered this Visit  Medications - No data to display  Filed Vitals:   12/28/14 1509  BP: 130/82  Pulse: 82  Temp: 98.6 F (37 C)  Resp: 16  There is no weight on file to calculate BMI.   Physical Exam  Constitutional: He is oriented to person, place, and time.  Cardiovascular: Normal rate, regular rhythm and normal heart sounds.  Exam reveals no gallop and no friction rub.   No murmur heard. Pulmonary/Chest: Effort normal and breath sounds normal. No respiratory distress. He has no wheezes. He has no rales. He exhibits no tenderness.  Musculoskeletal:       Right shoulder: He exhibits decreased range of motion, tenderness and pain. He exhibits no bony tenderness, no swelling, no effusion, no crepitus, no deformity, no laceration and normal strength.       Cervical back: He exhibits tenderness, pain and spasm. He exhibits normal range of motion and no swelling.  Neurological: He is alert and oriented to person, place, and time. He has normal reflexes.  Negative axial load test.    Skin: Skin is warm and dry.  Psychiatric: He has a normal mood and affect.  Nursing note  and vitals reviewed.   ED Course  Procedures (including critical care time)  Labs Review Labs Reviewed - No data to display  Imaging Review No results found.        MDM   1. Neck pain on left side    Most likely soft tissue.  Advised symptomatic care at home via AVS.  He is to follow up in 7 days if he is not improved or sooner if his symptoms change or worsen.     Tereasa Coop, PA-C 12/28/14 1537

## 2014-12-28 NOTE — ED Notes (Signed)
Pt here with c/o left neck stiffness, more during the night , started 2 dys ago Hx past injury x 1 yr ago Taking Ibuprofen, Flexeril with relief

## 2014-12-28 NOTE — Discharge Instructions (Signed)
Take 800 mg of ibuprofen (four tabs) every 8 hours for the next five days. Take 1000 mg of tylenol every 8 hours as needed for the next five days. Take Zantac (ranitidine) 150 mg (1 tab) in the morning and at night to protect your stomach given your history of GERD.  Continue flexeril.

## 2015-01-06 ENCOUNTER — Emergency Department: Payer: 59

## 2015-01-06 ENCOUNTER — Encounter: Payer: Self-pay | Admitting: *Deleted

## 2015-01-06 ENCOUNTER — Emergency Department
Admission: EM | Admit: 2015-01-06 | Discharge: 2015-01-06 | Disposition: A | Discharge status: Home / Self Care | Payer: 59 | Attending: Emergency Medicine | Admitting: Emergency Medicine

## 2015-01-06 DIAGNOSIS — W2209XA Striking against other stationary object, initial encounter: Secondary | ICD-10-CM | POA: Diagnosis not present

## 2015-01-06 DIAGNOSIS — S6991XA Unspecified injury of right wrist, hand and finger(s), initial encounter: Secondary | ICD-10-CM | POA: Diagnosis present

## 2015-01-06 DIAGNOSIS — Y998 Other external cause status: Secondary | ICD-10-CM | POA: Diagnosis not present

## 2015-01-06 DIAGNOSIS — Y9389 Activity, other specified: Secondary | ICD-10-CM | POA: Diagnosis not present

## 2015-01-06 DIAGNOSIS — Z79899 Other long term (current) drug therapy: Secondary | ICD-10-CM | POA: Insufficient documentation

## 2015-01-06 DIAGNOSIS — Z87891 Personal history of nicotine dependence: Secondary | ICD-10-CM | POA: Insufficient documentation

## 2015-01-06 DIAGNOSIS — S60221A Contusion of right hand, initial encounter: Secondary | ICD-10-CM | POA: Diagnosis not present

## 2015-01-06 DIAGNOSIS — Y9289 Other specified places as the place of occurrence of the external cause: Secondary | ICD-10-CM | POA: Diagnosis not present

## 2015-01-06 MED ORDER — TRAMADOL HCL 50 MG PO TABS: 50.0000 mg | ORAL_TABLET | Freq: Four times a day (QID) | ORAL | Status: AC | PRN

## 2015-01-06 NOTE — Discharge Instructions (Signed)
Contusion °A contusion is a deep bruise. Contusions are the result of a blunt injury to tissues and muscle fibers under the skin. The injury causes bleeding under the skin. The skin overlying the contusion may turn blue, purple, or yellow. Minor injuries will give you a painless contusion, but more severe contusions may stay painful and swollen for a few weeks.  °CAUSES  °This condition is usually caused by a blow, trauma, or direct force to an area of the body. °SYMPTOMS  °Symptoms of this condition include: °· Swelling of the injured area. °· Pain and tenderness in the injured area. °· Discoloration. The area may have redness and then turn blue, purple, or yellow. °DIAGNOSIS  °This condition is diagnosed based on a physical exam and medical history. An X-ray, CT scan, or MRI may be needed to determine if there are any associated injuries, such as broken bones (fractures). °TREATMENT  °Specific treatment for this condition depends on what area of the body was injured. In general, the best treatment for a contusion is resting, icing, applying pressure to (compression), and elevating the injured area. This is often called the RICE strategy. Over-the-counter anti-inflammatory medicines may also be recommended for pain control.  °HOME CARE INSTRUCTIONS  °· Rest the injured area. °· If directed, apply ice to the injured area: °¨ Put ice in a plastic bag. °¨ Place a towel between your skin and the bag. °¨ Leave the ice on for 20 minutes, 2-3 times per day. °· If directed, apply light compression to the injured area using an elastic bandage. Make sure the bandage is not wrapped too tightly. Remove and reapply the bandage as directed by your health care provider. °· If possible, raise (elevate) the injured area above the level of your heart while you are sitting or lying down. °· Take over-the-counter and prescription medicines only as told by your health care provider. °SEEK MEDICAL CARE IF: °· Your symptoms do not  improve after several days of treatment. °· Your symptoms get worse. °· You have difficulty moving the injured area. °SEEK IMMEDIATE MEDICAL CARE IF:  °· You have severe pain. °· You have numbness in a hand or foot. °· Your hand or foot turns pale or cold. °  °This information is not intended to replace advice given to you by your health care provider. Make sure you discuss any questions you have with your health care provider. °  °Document Released: 12/25/2004 Document Revised: 12/06/2014 Document Reviewed: 08/02/2014 °Elsevier Interactive Patient Education ©2016 [employer] ° °Cryotherapy °Cryotherapy means treatment with cold. Ice or gel packs can be used to reduce both pain and swelling. Ice is the most helpful within the first 24 to 48 hours after an injury or flare-up from overusing a muscle or joint. Sprains, strains, spasms, burning pain, shooting pain, and aches can all be eased with ice. Ice can also be used when recovering from surgery. Ice is effective, has very few side effects, and is safe for most people to use. °PRECAUTIONS  °Ice is not a safe treatment option for people with: °· Raynaud phenomenon. This is a condition affecting small blood vessels in the extremities. Exposure to cold may cause your problems to return. °· Cold hypersensitivity. There are many forms of cold hypersensitivity, including: °· Cold urticaria. Red, itchy hives appear on the skin when the tissues begin to warm after being iced. °· Cold erythema. This is a red, itchy rash caused by exposure to cold. °· Cold hemoglobinuria. Red blood cells   break down when the tissues begin to warm after being iced. The hemoglobin that carry oxygen are passed into the urine because they cannot combine with blood proteins fast enough. °· Numbness or altered sensitivity in the area being iced. °If you have any of the following conditions, do not use ice until you have discussed cryotherapy with your caregiver: °· Heart conditions, such as  arrhythmia, angina, or chronic heart disease. °· High blood pressure. °· Healing wounds or open skin in the area being iced. °· Current infections. °· Rheumatoid arthritis. °· Poor circulation. °· Diabetes. °Ice slows the blood flow in the region it is applied. This is beneficial when trying to stop inflamed tissues from spreading irritating chemicals to surrounding tissues. However, if you expose your skin to cold temperatures for too long or without the proper protection, you can damage your skin or nerves. Watch for signs of skin damage due to cold. °HOME CARE INSTRUCTIONS °Follow these tips to use ice and cold packs safely. °· Place a dry or damp towel between the ice and skin. A damp towel will cool the skin more quickly, so you may need to shorten the time that the ice is used. °· For a more rapid response, add gentle compression to the ice. °· Ice for no more than 10 to 20 minutes at a time. The bonier the area you are icing, the less time it will take to get the benefits of ice. °· Check your skin after 5 minutes to make sure there are no signs of a poor response to cold or skin damage. °· Rest 20 minutes or more between uses. °· Once your skin is numb, you can end your treatment. You can test numbness by very lightly touching your skin. The touch should be so light that you do not see the skin dimple from the pressure of your fingertip. When using ice, most people will feel these normal sensations in this order: cold, burning, aching, and numbness. °· Do not use ice on someone who cannot communicate their responses to pain, such as small children or people with dementia. °HOW TO MAKE AN ICE PACK °Ice packs are the most common way to use ice therapy. Other methods include ice massage, ice baths, and cryosprays. Muscle creams that cause a cold, tingly feeling do not offer the same benefits that ice offers and should not be used as a substitute unless recommended by your caregiver. °To make an ice pack, do one  of the following: °· Place crushed ice or a bag of frozen vegetables in a sealable plastic bag. Squeeze out the excess air. Place this bag inside another plastic bag. Slide the bag into a pillowcase or place a damp towel between your skin and the bag. °· Mix 3 parts water with 1 part rubbing alcohol. Freeze the mixture in a sealable plastic bag. When you remove the mixture from the freezer, it will be slushy. Squeeze out the excess air. Place this bag inside another plastic bag. Slide the bag into a pillowcase or place a damp towel between your skin and the bag. °SEEK MEDICAL CARE IF: °· You develop white spots on your skin. This may give the skin a blotchy (mottled) appearance. °· Your skin turns blue or pale. °· Your skin becomes waxy or hard. °· Your swelling gets worse. °MAKE SURE YOU:  °· Understand these instructions. °· Will watch your condition. °· Will get help right away if you are not doing well or get worse. °  °  This information is not intended to replace advice given to you by your health care provider. Make sure you discuss any questions you have with your health care provider.   Document Released: 11/11/2010 Document Revised: 04/07/2014 Document Reviewed: 11/11/2010 Elsevier Interactive Patient Education 2016 [place] A hand contusion is a deep bruise on your hand area. Contusions are the result of an injury that caused bleeding under the skin. The contusion may turn blue, purple, or yellow. Minor injuries will give you a painless contusion, but more severe contusions may stay painful and swollen for a few weeks. CAUSES  A contusion is usually caused by a blow, trauma, or direct force to an area of the body. SYMPTOMS   Swelling and redness of the injured area.  Discoloration of the injured area.  Tenderness and soreness of the injured area.  Pain. DIAGNOSIS  The diagnosis can be made by taking a history and performing a physical exam. An X-ray, CT scan, or MRI  may be needed to determine if there were any associated injuries, such as broken bones (fractures). TREATMENT  Often, the best treatment for a hand contusion is resting, elevating, icing, and applying cold compresses to the injured area. Over-the-counter medicines may also be recommended for pain control. HOME CARE INSTRUCTIONS   Put ice on the injured area.  Put ice in a plastic bag.  Place a towel between your skin and the bag.  Leave the ice on for 15-20 minutes, 03-04 times a day.  Only take over-the-counter or prescription medicines as directed by your caregiver. Your caregiver may recommend avoiding anti-inflammatory medicines (aspirin, ibuprofen, and naproxen) for 48 hours because these medicines may increase bruising.  If told, use an elastic wrap as directed. This can help reduce swelling. You may remove the wrap for sleeping, showering, and bathing. If your fingers become numb, cold, or blue, take the wrap off and reapply it more loosely.  Elevate your hand with pillows to reduce swelling.  Avoid overusing your hand if it is painful. SEEK IMMEDIATE MEDICAL CARE IF:   You have increased redness, swelling, or pain in your hand.  Your swelling or pain is not relieved with medicines.  You have loss of feeling in your hand or are unable to move your fingers.  Your hand turns cold or blue.  You have pain when you move your fingers.  Your hand becomes warm to the touch.  Your contusion does not improve in 2 days. MAKE SURE YOU:   Understand these instructions.  Will watch your condition.  Will get help right away if you are not doing well or get worse.   This information is not intended to replace advice given to you by your health care provider. Make sure you discuss any questions you have with your health care provider.   Document Released: 09/06/2001 Document Revised: 12/10/2011 Document Reviewed: 09/08/2011 Elsevier Interactive Patient Education [employer].

## 2015-01-06 NOTE — ED Provider Notes (Signed)
CSN: [id number]     Arrival date & time 01/06/15  2002 History   First MD Initiated Contact with Patient 01/06/15 2026     Chief Complaint  Patient presents with  . Hand Pain     (Consider location/radiation/quality/duration/timing/severity/associated sxs/prior Treatment) HPI  39 year old male presents to the emergency department for evaluation of right hand pain. Patient states earlier today he punched a board attempting to break the board and half. Patient has had significant 8 out of 10 pain along the right third MCP joint. He denies any wrist pain, numbness or tingling. He has had no relief with ibuprofen 600 mg. Right-hand dominant. He is able to move the digits.  Past Medical History  Diagnosis Date  . Allergy   . Malignant melanoma of right forearm ([place])     Followed by Dermaotology - Dx 05/2008  . GERD (gastroesophageal reflux disease)    History reviewed. No pertinent past surgical history. Family History  Problem Relation Age of Onset  . COPD Father   . Hypertension Father    Social History  Substance Use Topics  . Smoking status: Former Smoker -- 1.00 packs/day for 5 years    Types: Cigarettes    Quit date: 01/02/2006  . Smokeless tobacco: Never Used  . Alcohol Use: Yes     Comment: A couple of drinks per week    Review of Systems  Constitutional: Negative.  Negative for fever, chills, activity change and appetite change.  HENT: Negative for congestion, ear pain, mouth sores, rhinorrhea, sinus pressure, sore throat and trouble swallowing.   Eyes: Negative for photophobia, pain and discharge.  Respiratory: Negative for cough, chest tightness and shortness of breath.   Cardiovascular: Negative for chest pain and leg swelling.  Gastrointestinal: Negative for nausea, vomiting, abdominal pain, diarrhea and abdominal distention.  Genitourinary: Negative for dysuria and difficulty urinating.  Musculoskeletal: Positive for joint swelling and arthralgias. Negative for  back pain and gait problem.  Skin: Negative for color change and rash.  Neurological: Negative for dizziness and headaches.  Hematological: Negative for adenopathy.  Psychiatric/Behavioral: Negative for behavioral problems and agitation.      Allergies  Review of patient's allergies indicates no known allergies.  Home Medications   Prior to Admission medications   Medication Sig Start Date End Date Taking? Authorizing Provider  buPROPion (WELLBUTRIN XL) 150 MG 24 hr tablet Take 150 mg by mouth daily.    Historical Provider, MD  cyclobenzaprine (FLEXERIL) 5 MG tablet Take 5 mg by mouth 3 (three) times daily as needed for muscle spasms.    Historical Provider, MD  traMADol (ULTRAM) 50 MG tablet Take by mouth every 6 (six) hours as needed.    Historical Provider, MD  traMADol (ULTRAM) 50 MG tablet Take 1-2 tablets (50-100 mg total) by mouth every 6 (six) hours as needed. 01/06/15   Duanne Guess, PA-C   BP 140/93 mmHg  Pulse 91  Temp(Src) 98.2 F (36.8 C) (Oral)  Ht 5\' 8"  (1.727 m)  Wt 170 lb (77.111 kg)  BMI 25.85 kg/m2  SpO2 99% Physical Exam  Constitutional: He is oriented to person, place, and time. He appears well-developed and well-nourished.  HENT:  Head: Normocephalic and atraumatic.  Eyes: Conjunctivae and EOM are normal.  Neck: Normal range of motion. Neck supple.  Cardiovascular: Normal rate and intact distal pulses.   Pulmonary/Chest: Effort normal. No respiratory distress.  Abdominal: Soft. Bowel sounds are normal. He exhibits no distension. There is no tenderness.  Musculoskeletal:  Examination of the right hand shows the patient lacks 2 cm of full composite fist. There is swelling at the right third MCP joint. He is tender to palpation of the right third MCP joint. There is no angulation or rotation of the digits. No skin breakdown noted. He is nontender along the metacarpal shafts. No wrist tenderness to palpation. He is neurovascular intact in right upper  extremity.  Neurological: He is alert and oriented to person, place, and time.  Skin: Skin is warm and dry.  Psychiatric: He has a normal mood and affect. His behavior is normal. Judgment and thought content normal.    ED Course  Procedures (including critical care time) Labs Review Labs Reviewed - No data to display  Imaging Review Dg Hand Complete Right  01/06/2015   CLINICAL DATA:  Acute onset of right hand pain after punching board. Limited range of motion and soft tissue swelling. Initial encounter.  EXAM: RIGHT HAND - COMPLETE 3+ VIEW  COMPARISON:  None.  FINDINGS: There is no evidence of fracture or dislocation. The joint spaces are preserved. The carpal rows are intact, and demonstrate normal alignment. The soft tissues are unremarkable in appearance.  IMPRESSION: No evidence of fracture or dislocation.   Electronically Signed   By: Garald Balding M.D.   On: 01/06/2015 20:49   I have personally reviewed and evaluated these images and lab results as part of my medical decision-making.   EKG Interpretation None      MDM   Final diagnoses:  Hand contusion, right, initial encounter    39 year old male with contusion to the right hand. X-ray show no evidence of acute bony abnormality. He will rest ice and elevate the upper extremity. Work on gentle range of motion of the digits. Ibuprofen 800 mg 3 times a day when necessary pain. Tramadol 50 mg 1-2 tabs by mouth every 6 hours when necessary pain quantity #20 with 0 refills. Follow-up with orthopedics if no improvement in 5-7 days.    Duanne Guess, PA-C 01/06/15 2103  Eula Listen, MD 01/06/15 2220

## 2015-01-06 NOTE — ED Notes (Signed)
Pt reports hitting board with right hand. Pain present in fingers wrist and forearm. Pt is able to move hand but does not have full range of motion and what movement is possible is slow. Circulation and sensation intact. No obvious deformities noted.

## 2015-01-08 ENCOUNTER — Other Ambulatory Visit: Payer: Self-pay | Admitting: Orthopedic Surgery

## 2015-01-08 DIAGNOSIS — M5442 Lumbago with sciatica, left side: Secondary | ICD-10-CM

## 2015-01-17 ENCOUNTER — Ambulatory Visit: Payer: 59

## 2015-02-20 ENCOUNTER — Ambulatory Visit: Payer: 59

## 2015-03-29 ENCOUNTER — Encounter: Payer: Self-pay | Admitting: *Deleted

## 2015-03-29 ENCOUNTER — Emergency Department
Admission: EM | Admit: 2015-03-29 | Discharge: 2015-03-29 | Disposition: A | Discharge status: Home / Self Care | Payer: 59 | Attending: Emergency Medicine | Admitting: Emergency Medicine

## 2015-03-29 ENCOUNTER — Emergency Department: Payer: 59

## 2015-03-29 DIAGNOSIS — W208XXA Other cause of strike by thrown, projected or falling object, initial encounter: Secondary | ICD-10-CM | POA: Insufficient documentation

## 2015-03-29 DIAGNOSIS — Y998 Other external cause status: Secondary | ICD-10-CM | POA: Insufficient documentation

## 2015-03-29 DIAGNOSIS — Z79899 Other long term (current) drug therapy: Secondary | ICD-10-CM | POA: Diagnosis not present

## 2015-03-29 DIAGNOSIS — Y9389 Activity, other specified: Secondary | ICD-10-CM | POA: Insufficient documentation

## 2015-03-29 DIAGNOSIS — Z87891 Personal history of nicotine dependence: Secondary | ICD-10-CM | POA: Insufficient documentation

## 2015-03-29 DIAGNOSIS — Y9289 Other specified places as the place of occurrence of the external cause: Secondary | ICD-10-CM | POA: Insufficient documentation

## 2015-03-29 DIAGNOSIS — S9032XA Contusion of left foot, initial encounter: Secondary | ICD-10-CM | POA: Insufficient documentation

## 2015-03-29 DIAGNOSIS — S99922A Unspecified injury of left foot, initial encounter: Secondary | ICD-10-CM | POA: Diagnosis present

## 2015-03-29 MED ORDER — IBUPROFEN 800 MG PO TABS: 800.0000 mg | ORAL_TABLET | Freq: Three times a day (TID) | ORAL | Status: AC | PRN

## 2015-03-29 MED ORDER — HYDROCODONE-ACETAMINOPHEN 5-325 MG PO TABS: 1.0000 | ORAL_TABLET | ORAL | Status: AC | PRN

## 2015-03-29 NOTE — Discharge Instructions (Signed)

## 2015-03-29 NOTE — ED Notes (Signed)
Cement block fell on left foot today c/o pain

## 2015-03-29 NOTE — ED Provider Notes (Signed)
[facility] Emergency Department Provider Note ____________________________________________  Time seen: Approximately 1:48 PM  I have reviewed the triage vital signs and the nursing notes.   HISTORY  Chief Complaint Foot Pain   HPI Taylor Diaz is a 39 y.o. male who presents to the emergency department for evaluation of left foot pain after dropping a cinder block on top of it earlier this afternoon. He has pain with bearing weight and flexing his toes. He has not taken anything for pain since the incident.    Past Medical History  Diagnosis Date  . Allergy   . Malignant melanoma of right forearm ([place])     Followed by Dermaotology - Dx 05/2008  . GERD (gastroesophageal reflux disease)     Patient Active Problem List   Diagnosis Date Noted  . 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 NIGHT SWEATS 04/11/2010  . MELANOMA 07/24/2009  . ALLERGIC RHINITIS 07/24/2009  . GERD 07/24/2009  . KNEE PAIN, RIGHT 07/24/2009  . MUSCLE CRAMPS 07/24/2009    History reviewed. No pertinent past surgical history.  Current Outpatient Rx  Name  Route  Sig  Dispense  Refill  . buPROPion (WELLBUTRIN XL) 150 MG 24 hr tablet   Oral   Take 150 mg by mouth daily.         . cyclobenzaprine (FLEXERIL) 5 MG tablet   Oral   Take 5 mg by mouth 3 (three) times daily as needed for muscle spasms.         Marland Kitchen HYDROcodone-acetaminophen (NORCO/VICODIN) 5-325 MG tablet   Oral   Take 1 tablet by mouth every 4 (four) hours as needed.   12 tablet   0   . ibuprofen (ADVIL,MOTRIN) 800 MG tablet   Oral   Take 1 tablet (800 mg total) by mouth every 8 (eight) hours as needed.   30 tablet   0   . traMADol (ULTRAM) 50 MG tablet   Oral   Take by mouth every 6 (six) hours as needed.         . traMADol (ULTRAM) 50 MG tablet   Oral   Take 1-2 tablets (50-100 mg total) by mouth every 6 (six) hours as needed.   20 tablet   0     Allergies Review of  patient's allergies indicates no known allergies.  Family History  Problem Relation Age of Onset  . COPD Father   . Hypertension Father     Social History Social History  Substance Use Topics  . Smoking status: Former Smoker -- 1.00 packs/day for 5 years    Types: Cigarettes    Quit date: 01/02/2006  . Smokeless tobacco: Never Used  . Alcohol Use: Yes     Comment: A couple of drinks per week    Review of Systems Constitutional: No recent illness. Eyes: No visual changes. ENT: No sore throat. Cardiovascular: Denies chest pain or palpitations. Respiratory: Denies shortness of breath. Gastrointestinal: No abdominal pain.  Genitourinary: Negative for dysuria. Musculoskeletal: Pain in left foot Skin: Negative for rash. Neurological: Negative for headaches, focal weakness or numbness. 10-point ROS otherwise negative.  ____________________________________________   PHYSICAL EXAM:  VITAL SIGNS: ED Triage Vitals  Enc Vitals Group     BP 03/29/15 1307 135/82 mmHg     Pulse Rate 03/29/15 1307 102     Resp 03/29/15 1307 20     Temp 03/29/15 1307 98.2 F (36.8 C)     Temp Source 03/29/15 1307 Oral  SpO2 03/29/15 1307 97 %     Weight 03/29/15 1307 167 lb (75.751 kg)     Height 03/29/15 1307 5\' 9"  (1.753 m)     Head Cir --      Peak Flow --      Pain Score --      Pain Loc --      Pain Edu? --      Excl. in [place]? --     Constitutional: Alert and oriented. Well appearing and in no acute distress. Eyes: Conjunctivae are normal. EOMI. Head: Atraumatic. Nose: No congestion/rhinnorhea. Neck: No stridor.  Respiratory: Normal respiratory effort.   Musculoskeletal: [place] ankle rules are negative. Tenderness over the 3rd-5th metatarsals. Neurologic:  Normal speech and language. No gross focal neurologic deficits are appreciated. Speech is normal. No gait instability. Skin:  Skin is warm, dry and intact. Contusion over the 3-4 metatarsals.  Psychiatric: Mood and affect are  normal. Speech and behavior are normal.  ____________________________________________   LABS (all labs ordered are listed, but only abnormal results are displayed)  Labs Reviewed - No data to display ____________________________________________  RADIOLOGY  Negative for acute bony abnormality. ____________________________________________   PROCEDURES  Procedure(s) performed: ACE bandage applied to left foot by ER tech. Neurovascularly intact post application.    ____________________________________________   INITIAL IMPRESSION / ASSESSMENT AND PLAN / ED COURSE  Pertinent labs & imaging results that were available during my care of the patient were reviewed by me and considered in my medical decision making (see chart for details).  Patient was advised to follow up with the podiatrist for symptoms that are not improving over the week. He was advised to return to the ER for symptoms that change or worsen if unable to schedule an appointment. He is to take ibuprofen as needed and use hydrocodone for severe or breakthrough pain. ____________________________________________   FINAL CLINICAL IMPRESSION(S) / ED DIAGNOSES  Final diagnoses:  Contusion, foot, left, initial encounter       Victorino Dike, FNP 03/29/15 1518  Nena Polio, MD 03/29/15 1721

## 2015-03-29 NOTE — ED Notes (Signed)
States he dropped a cider block on top of left foot  Unable to bear full wt.

## 2015-03-31 ENCOUNTER — Emergency Department: Payer: 59

## 2015-03-31 ENCOUNTER — Emergency Department
Admission: EM | Admit: 2015-03-31 | Discharge: 2015-03-31 | Disposition: A | Discharge status: Home / Self Care | Payer: 59 | Attending: Emergency Medicine | Admitting: Emergency Medicine

## 2015-03-31 DIAGNOSIS — Z87891 Personal history of nicotine dependence: Secondary | ICD-10-CM | POA: Insufficient documentation

## 2015-03-31 DIAGNOSIS — Z79899 Other long term (current) drug therapy: Secondary | ICD-10-CM | POA: Diagnosis not present

## 2015-03-31 DIAGNOSIS — S99922A Unspecified injury of left foot, initial encounter: Secondary | ICD-10-CM | POA: Diagnosis present

## 2015-03-31 DIAGNOSIS — Y998 Other external cause status: Secondary | ICD-10-CM | POA: Diagnosis not present

## 2015-03-31 DIAGNOSIS — S9032XA Contusion of left foot, initial encounter: Secondary | ICD-10-CM | POA: Diagnosis not present

## 2015-03-31 DIAGNOSIS — Y9389 Activity, other specified: Secondary | ICD-10-CM | POA: Insufficient documentation

## 2015-03-31 DIAGNOSIS — W500XXA Accidental hit or strike by another person, initial encounter: Secondary | ICD-10-CM | POA: Diagnosis not present

## 2015-03-31 DIAGNOSIS — Y9289 Other specified places as the place of occurrence of the external cause: Secondary | ICD-10-CM | POA: Insufficient documentation

## 2015-03-31 MED ORDER — TRAMADOL HCL 50 MG PO TABS: 50.0000 mg | ORAL_TABLET | Freq: Four times a day (QID) | ORAL | Status: AC | PRN

## 2015-03-31 NOTE — ED Provider Notes (Signed)
[facility] Emergency Department Provider Note  ____________________________________________  Time seen: Approximately 11:55 AM  I have reviewed the triage vital signs and the nursing notes.   HISTORY  Chief Complaint Foot Pain    HPI Barrie Schwitzer is a 39 y.o. male patient complaining of left foot pain was worsened since his initial injury on 03/29/2015. Patient was seen at that time due to a contusion by cinderblock on the dorsal aspect of his foot. Patient had a negative x-ray and was given Percocet/ ibuprofen. Patient state yesterday his son stepped on his foot. Patient stated there is increased pain and increased edema this morning from his previous injury.Patient is rating his pain as a 10 over 10.   Past Medical History  Diagnosis Date  . Allergy   . Malignant melanoma of right forearm ([place])     Followed by Dermaotology - Dx 05/2008  . GERD (gastroesophageal reflux disease)     Patient Active Problem List   Diagnosis Date Noted  . 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 NIGHT SWEATS 04/11/2010  . MELANOMA 07/24/2009  . ALLERGIC RHINITIS 07/24/2009  . GERD 07/24/2009  . KNEE PAIN, RIGHT 07/24/2009  . MUSCLE CRAMPS 07/24/2009    No past surgical history on file.  Current Outpatient Rx  Name  Route  Sig  Dispense  Refill  . buPROPion (WELLBUTRIN XL) 150 MG 24 hr tablet   Oral   Take 150 mg by mouth daily.         . cyclobenzaprine (FLEXERIL) 5 MG tablet   Oral   Take 5 mg by mouth 3 (three) times daily as needed for muscle spasms.         Marland Kitchen HYDROcodone-acetaminophen (NORCO/VICODIN) 5-325 MG tablet   Oral   Take 1 tablet by mouth every 4 (four) hours as needed.   12 tablet   0   . ibuprofen (ADVIL,MOTRIN) 800 MG tablet   Oral   Take 1 tablet (800 mg total) by mouth every 8 (eight) hours as needed.   30 tablet   0   . traMADol (ULTRAM) 50 MG tablet   Oral   Take by mouth every 6 (six) hours as needed.          . traMADol (ULTRAM) 50 MG tablet   Oral   Take 1-2 tablets (50-100 mg total) by mouth every 6 (six) hours as needed.   20 tablet   0   . traMADol (ULTRAM) 50 MG tablet   Oral   Take 1 tablet (50 mg total) by mouth every 6 (six) hours as needed for moderate pain.   12 tablet   0     Allergies Review of patient's allergies indicates no known allergies.  Family History  Problem Relation Age of Onset  . COPD Father   . Hypertension Father     Social History Social History  Substance Use Topics  . Smoking status: Former Smoker -- 1.00 packs/day for 5 years    Types: Cigarettes    Quit date: 01/02/2006  . Smokeless tobacco: Never Used  . Alcohol Use: Yes     Comment: A couple of drinks per week    Review of Systems Constitutional: No fever/chills Eyes: No visual changes. ENT: No sore throat. Cardiovascular: Denies chest pain. Respiratory: Denies shortness of breath. Gastrointestinal: No abdominal pain.  No nausea, no vomiting.  No diarrhea.  No constipation. Genitourinary: Negative for dysuria. Musculoskeletal: Left foot pain. Skin: Negative for rash. Neurological:  Negative for headaches, focal weakness or numbness. 10-point ROS otherwise negative.  ____________________________________________   PHYSICAL EXAM:  VITAL SIGNS: ED Triage Vitals  Enc Vitals Group     BP 03/31/15 1136 140/69 mmHg     Pulse Rate 03/31/15 1136 81     Resp 03/31/15 1136 16     Temp 03/31/15 1136 98.1 F (36.7 C)     Temp Source 03/31/15 1136 Oral     SpO2 03/31/15 1136 98 %     Weight --      Height --      Head Cir --      Peak Flow --      Pain Score 03/31/15 1137 10     Pain Loc --      Pain Edu? --      Excl. in [place]? --     Constitutional: Alert and oriented. Well appearing and in no acute distress. Eyes: Conjunctivae are normal. PERRL. EOMI. Head: Atraumatic. Nose: No congestion/rhinnorhea. Mouth/Throat: Mucous membranes are moist.  Oropharynx  non-erythematous. Neck: No stridor. No cervical spine tenderness to palpation. Hematological/Lymphatic/Immunilogical: No cervical lymphadenopathy. Cardiovascular: Normal rate, regular rhythm. Grossly normal heart sounds.  Good peripheral circulation. Respiratory: Normal respiratory effort.  No retractions. Lungs CTAB. Gastrointestinal: Soft and nontender. No distention. No abdominal bruits. No CVA tenderness. Musculoskeletal: Obvious edema to the dorsal aspect the left foot. No obvious deformity. Neurologic:  Normal speech and language. No gross focal neurologic deficits are appreciated. No gait instability. Skin:  Skin is warm, dry and intact. No rash noted. Psychiatric: Mood and affect are normal. Speech and behavior are normal.  ____________________________________________   LABS (all labs ordered are listed, but only abnormal results are displayed)  Labs Reviewed - No data to display ____________________________________________  EKG   ____________________________________________  RADIOLOGY  No acute findings on x-ray soft tissue edema is apparent.  ____________________________________________   PROCEDURES  Procedure(s) performed: None  Critical Care performed: No  ____________________________________________   INITIAL IMPRESSION / ASSESSMENT AND PLAN / ED COURSE  Pertinent labs & imaging results that were available during my care of the patient were reviewed by me and considered in my medical decision making (see chart for details).  Contusion left dorsal aspect of foot. Discuss negative x-ray findings with patient. Patient placed an open shoe and given crutches for ambulation. Patient given prescription for tramadol. ____________________________________________   FINAL CLINICAL IMPRESSION(S) / ED DIAGNOSES  Final diagnoses:  Foot contusion, left, initial encounter      GALILEO SIECK, PA-C 03/31/15 [place], MD 04/03/15 1041

## 2015-03-31 NOTE — ED Notes (Signed)
Pt injured foot on 12/29. Was seen here. Returns today for increased pain. Reports that his child stepped on his foot. Left foot is swollen and tender.

## 2015-03-31 NOTE — ED Notes (Signed)
Discussed discharge instructions, prescriptions, and follow-up care with patient. No questions or concerns at this time. Pt stable at discharge.  

## 2015-03-31 NOTE — ED Notes (Signed)
Pt has been taking ibuprofen, hydrocodone, and putting ice to L foot since previous discharge.

## 2015-03-31 NOTE — Discharge Instructions (Signed)
Foot Contusion  A foot contusion is a deep bruise to the foot. Contusions happen when an injury causes bleeding under the skin. Signs of bruising include pain, puffiness (swelling), and discolored skin. The contusion may turn blue, purple, or yellow. HOME CARE  Put ice on the injured area.  Put ice in a plastic bag.  Place a towel between your skin and the bag.  Leave the ice on for 15-20 minutes, 03-04 times a day.  Only take medicines as told by your doctor.  Use an elastic wrap only as told. You may remove the wrap for sleeping, showering, and bathing. Take the wrap off if you lose feeling (numb) in your toes, or they turn blue or cold. Put the wrap on more loosely.  Keep the foot raised (elevated) with pillows.  If your foot hurts, avoid standing or walking.  When your doctor says it is okay to use your foot, start using it slowly. If you have pain, lessen how much you use your foot.  See your doctor as told. GET HELP RIGHT AWAY IF:   You have more redness, puffiness, or pain in your foot.  Your puffiness or pain does not get better with medicine.  You lose feeling in your foot, or you cannot move your toes.  Your foot turns cold or blue.  You have pain when you move your toes.  Your foot feels warm.  Your contusion does not get better in 2 days. MAKE SURE YOU:   Understand these instructions.  Will watch this condition.  Will get help right away if you or your child is not doing well or gets worse.   This information is not intended to replace advice given to you by your health care provider. Make sure you discuss any questions you have with your health care provider.   Document Released: 12/25/2007 Document Revised: 09/16/2011 Document Reviewed: 11/21/2014 Elsevier Interactive Patient Education [employer].

## 2015-04-02 ENCOUNTER — Encounter: Payer: Self-pay | Admitting: Emergency Medicine

## 2015-04-02 ENCOUNTER — Emergency Department
Admission: EM | Admit: 2015-04-02 | Discharge: 2015-04-02 | Disposition: A | Discharge status: Home / Self Care | Payer: 59 | Attending: Emergency Medicine | Admitting: Emergency Medicine

## 2015-04-02 DIAGNOSIS — S9032XS Contusion of left foot, sequela: Secondary | ICD-10-CM | POA: Insufficient documentation

## 2015-04-02 DIAGNOSIS — X58XXXS Exposure to other specified factors, sequela: Secondary | ICD-10-CM | POA: Diagnosis not present

## 2015-04-02 DIAGNOSIS — Z79899 Other long term (current) drug therapy: Secondary | ICD-10-CM | POA: Diagnosis not present

## 2015-04-02 DIAGNOSIS — Z87891 Personal history of nicotine dependence: Secondary | ICD-10-CM | POA: Diagnosis not present

## 2015-04-02 DIAGNOSIS — S90122S Contusion of left lesser toe(s) without damage to nail, sequela: Secondary | ICD-10-CM

## 2015-04-02 DIAGNOSIS — M79672 Pain in left foot: Secondary | ICD-10-CM | POA: Diagnosis present

## 2015-04-02 NOTE — ED Provider Notes (Signed)
[facility] Emergency Department Provider Note  ____________________________________________  Time seen: Approximately 4:05 PM  I have reviewed the triage vital signs and the nursing notes.   HISTORY  Chief Complaint Foot Pain    HPI Taylor Diaz is a 40 y.o. male patient here today for his third visit secondary to contusion of the left foot. Patient's concern about the color changes occurring on the dorsal aspect of the foot. He stated the pain is decreasingto unable to fully bear weight. Patient currently rates his pain as a 7/10. Patient describes the pain as "dull". Patient is continue taking ibuprofen and tramadol as directed. Patient had limited weightbearing and ambulate with crutches.   Past Medical History  Diagnosis Date  . Allergy   . Malignant melanoma of right forearm ([place])     Followed by Dermaotology - Dx 05/2008  . GERD (gastroesophageal reflux disease)     Patient Active Problem List   Diagnosis Date Noted  . 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 NIGHT SWEATS 04/11/2010  . MELANOMA 07/24/2009  . ALLERGIC RHINITIS 07/24/2009  . GERD 07/24/2009  . KNEE PAIN, RIGHT 07/24/2009  . MUSCLE CRAMPS 07/24/2009    History reviewed. No pertinent past surgical history.  Current Outpatient Rx  Name  Route  Sig  Dispense  Refill  . buPROPion (WELLBUTRIN XL) 150 MG 24 hr tablet   Oral   Take 150 mg by mouth daily.         . cyclobenzaprine (FLEXERIL) 5 MG tablet   Oral   Take 5 mg by mouth 3 (three) times daily as needed for muscle spasms.         Marland Kitchen HYDROcodone-acetaminophen (NORCO/VICODIN) 5-325 MG tablet   Oral   Take 1 tablet by mouth every 4 (four) hours as needed.   12 tablet   0   . ibuprofen (ADVIL,MOTRIN) 800 MG tablet   Oral   Take 1 tablet (800 mg total) by mouth every 8 (eight) hours as needed.   30 tablet   0   . traMADol (ULTRAM) 50 MG tablet   Oral   Take by mouth every 6 (six) hours as  needed.         . traMADol (ULTRAM) 50 MG tablet   Oral   Take 1-2 tablets (50-100 mg total) by mouth every 6 (six) hours as needed.   20 tablet   0   . traMADol (ULTRAM) 50 MG tablet   Oral   Take 1 tablet (50 mg total) by mouth every 6 (six) hours as needed for moderate pain.   12 tablet   0     Allergies Review of patient's allergies indicates no known allergies.  Family History  Problem Relation Age of Onset  . COPD Father   . Hypertension Father     Social History Social History  Substance Use Topics  . Smoking status: Former Smoker -- 1.00 packs/day for 5 years    Types: Cigarettes    Quit date: 01/02/2006  . Smokeless tobacco: Never Used  . Alcohol Use: Yes     Comment: A couple of drinks per week    Review of Systems Constitutional: No fever/chills Eyes: No visual changes. ENT: No sore throat. Cardiovascular: Denies chest pain. Respiratory: Denies shortness of breath. Gastrointestinal: No abdominal pain.  No nausea, no vomiting.  No diarrhea.  No constipation. Genitourinary: Negative for dysuria. Musculoskeletal: Left foot pain Skin: Negative for rash. Ecchymosis dorsal aspect of left foot  Neurological: Negative for headaches, focal weakness or numbness. 10-point ROS otherwise negative.  ____________________________________________   PHYSICAL EXAM:  VITAL SIGNS: ED Triage Vitals  Enc Vitals Group     BP 04/02/15 1535 136/86 mmHg     Pulse Rate 04/02/15 1535 98     Resp 04/02/15 1535 18     Temp 04/02/15 1535 98.2 F (36.8 C)     Temp Source 04/02/15 1535 Oral     SpO2 04/02/15 1535 99 %     Weight 04/02/15 1535 168 lb (76.204 kg)     Height 04/02/15 1535 5\' 8"  (1.727 m)     Head Cir --      Peak Flow --      Pain Score 04/02/15 1536 7     Pain Loc --      Pain Edu? --      Excl. in [place]? --     Constitutional: Alert and oriented. Well appearing and in no acute distress. Eyes: Conjunctivae are normal. PERRL. EOMI. Head:  Atraumatic. Nose: No congestion/rhinnorhea. Mouth/Throat: Mucous membranes are moist.  Oropharynx non-erythematous. Neck: No stridor.  No cervical spine tenderness to palpation. Hematological/Lymphatic/Immunilogical: No cervical lymphadenopathy. Cardiovascular: Normal rate, regular rhythm. Grossly normal heart sounds.  Good peripheral circulation. Respiratory: Normal respiratory effort.  No retractions. Lungs CTAB. Gastrointestinal: Soft and nontender. No distention. No abdominal bruits. No CVA tenderness. Musculoskeletal: No deformity left foot. Mild guarding palpation of the dorsal aspect left foot. Neurologic:  Normal speech and language. No gross focal neurologic deficits are appreciated. Patient ambulates with crutches.  Skin:  Skin is warm, dry and intact. No rash noted.: Changes consistent with receding ecchymosis of the left foot. Psychiatric: Mood and affect are normal. Speech and behavior are normal.  ____________________________________________   LABS (all labs ordered are listed, but only abnormal results are displayed)  Labs Reviewed - No data to display ____________________________________________  EKG   ____________________________________________  RADIOLOGY   ____________________________________________   PROCEDURES  Procedure(s) performed: None  Critical Care performed: No  ____________________________________________   INITIAL IMPRESSION / ASSESSMENT AND PLAN / ED COURSE  Pertinent labs & imaging results that were available during my care of the patient were reviewed by me and considered in my medical decision making (see chart for details).  Ecchymosis secondary to left foot contusion. Advised patient to continue previous medications. Advise aggressively attempted weightbearing. Follow-up with family doctor if complaint persists ____________________________________________   FINAL CLINICAL IMPRESSION(S) / ED DIAGNOSES  Final diagnoses:  Traumatic  ecchymosis of left foot, sequela  Contusion of left foot including toes, sequela      EKAMJOT BONIFACE, PA-C 04/02/15 1614  Hinda Kehr, MD 04/02/15 2334

## 2015-04-02 NOTE — Discharge Instructions (Signed)
Advised to continue previous medications aggressively increase weightbearing.

## 2015-04-02 NOTE — ED Notes (Signed)
INjured foot last Thursday, seen in ED.  Returned Saturday after foot swelling increased, returned to ED.  Returns to ED today due to black and blue areas to foot.  States foot remains painful, but is improving somewhat.

## 2015-04-16 ENCOUNTER — Other Ambulatory Visit (HOSPITAL_COMMUNITY): Payer: Self-pay | Admitting: Orthopedic Surgery

## 2015-04-16 DIAGNOSIS — M5417 Radiculopathy, lumbosacral region: Secondary | ICD-10-CM

## 2015-04-16 DIAGNOSIS — G8929 Other chronic pain: Secondary | ICD-10-CM

## 2015-04-16 DIAGNOSIS — M545 Low back pain: Principal | ICD-10-CM

## 2015-04-27 ENCOUNTER — Ambulatory Visit (HOSPITAL_COMMUNITY)
Admission: RE | Admit: 2015-04-27 | Discharge: 2015-04-27 | Disposition: A | Discharge status: Home / Self Care | Payer: 59 | Source: Ambulatory Visit | Attending: Orthopedic Surgery | Admitting: Orthopedic Surgery

## 2015-04-27 DIAGNOSIS — R200 Anesthesia of skin: Secondary | ICD-10-CM | POA: Diagnosis not present

## 2015-04-27 DIAGNOSIS — M545 Low back pain, unspecified: Secondary | ICD-10-CM

## 2015-04-27 DIAGNOSIS — G8929 Other chronic pain: Secondary | ICD-10-CM | POA: Insufficient documentation

## 2015-04-27 DIAGNOSIS — M5417 Radiculopathy, lumbosacral region: Secondary | ICD-10-CM | POA: Diagnosis not present

## 2017-01-05 IMAGING — CR DG HAND COMPLETE 3+V*R*
1 series · 3 of 3 positions shown · non-contrast
Comparison: None.

CLINICAL DATA: Acute onset of right hand pain after punching board.
Limited range of motion and soft tissue swelling. Initial encounter.

EXAM:
RIGHT HAND - COMPLETE 3+ VIEW

[Series 1: pa · 0.17mm/px · 3 of 3 slices shown]
[im 1/3]
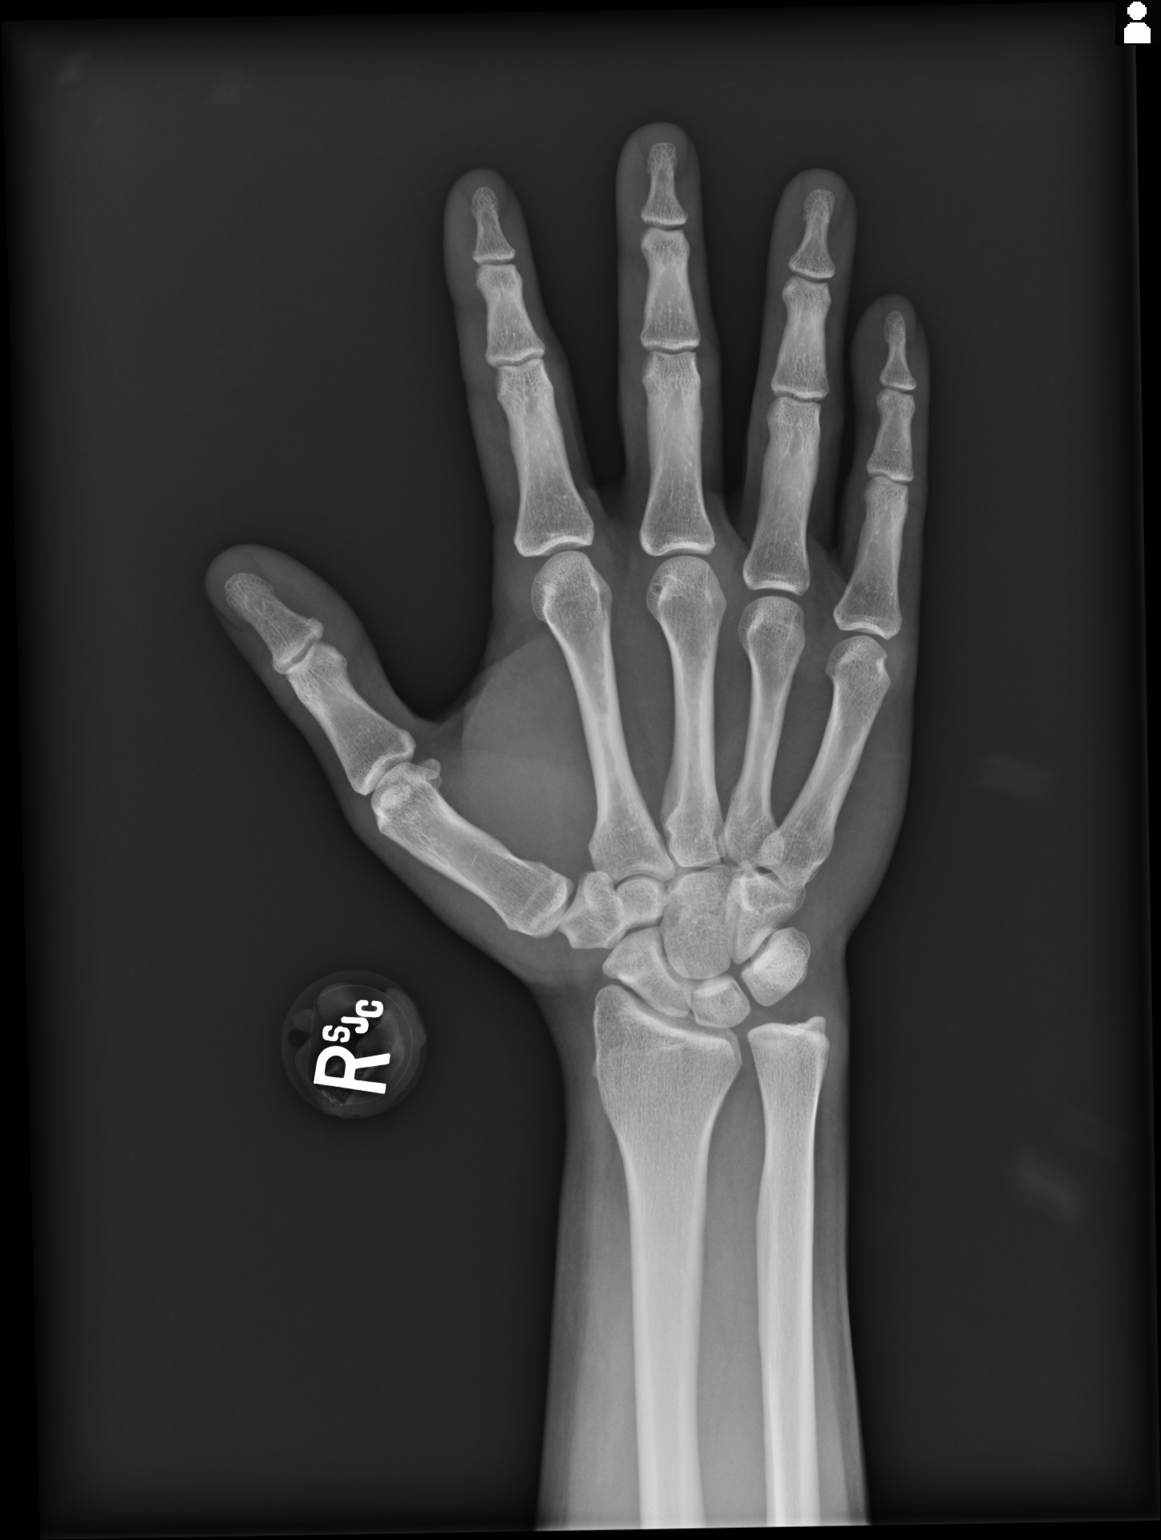
[im 2/3]
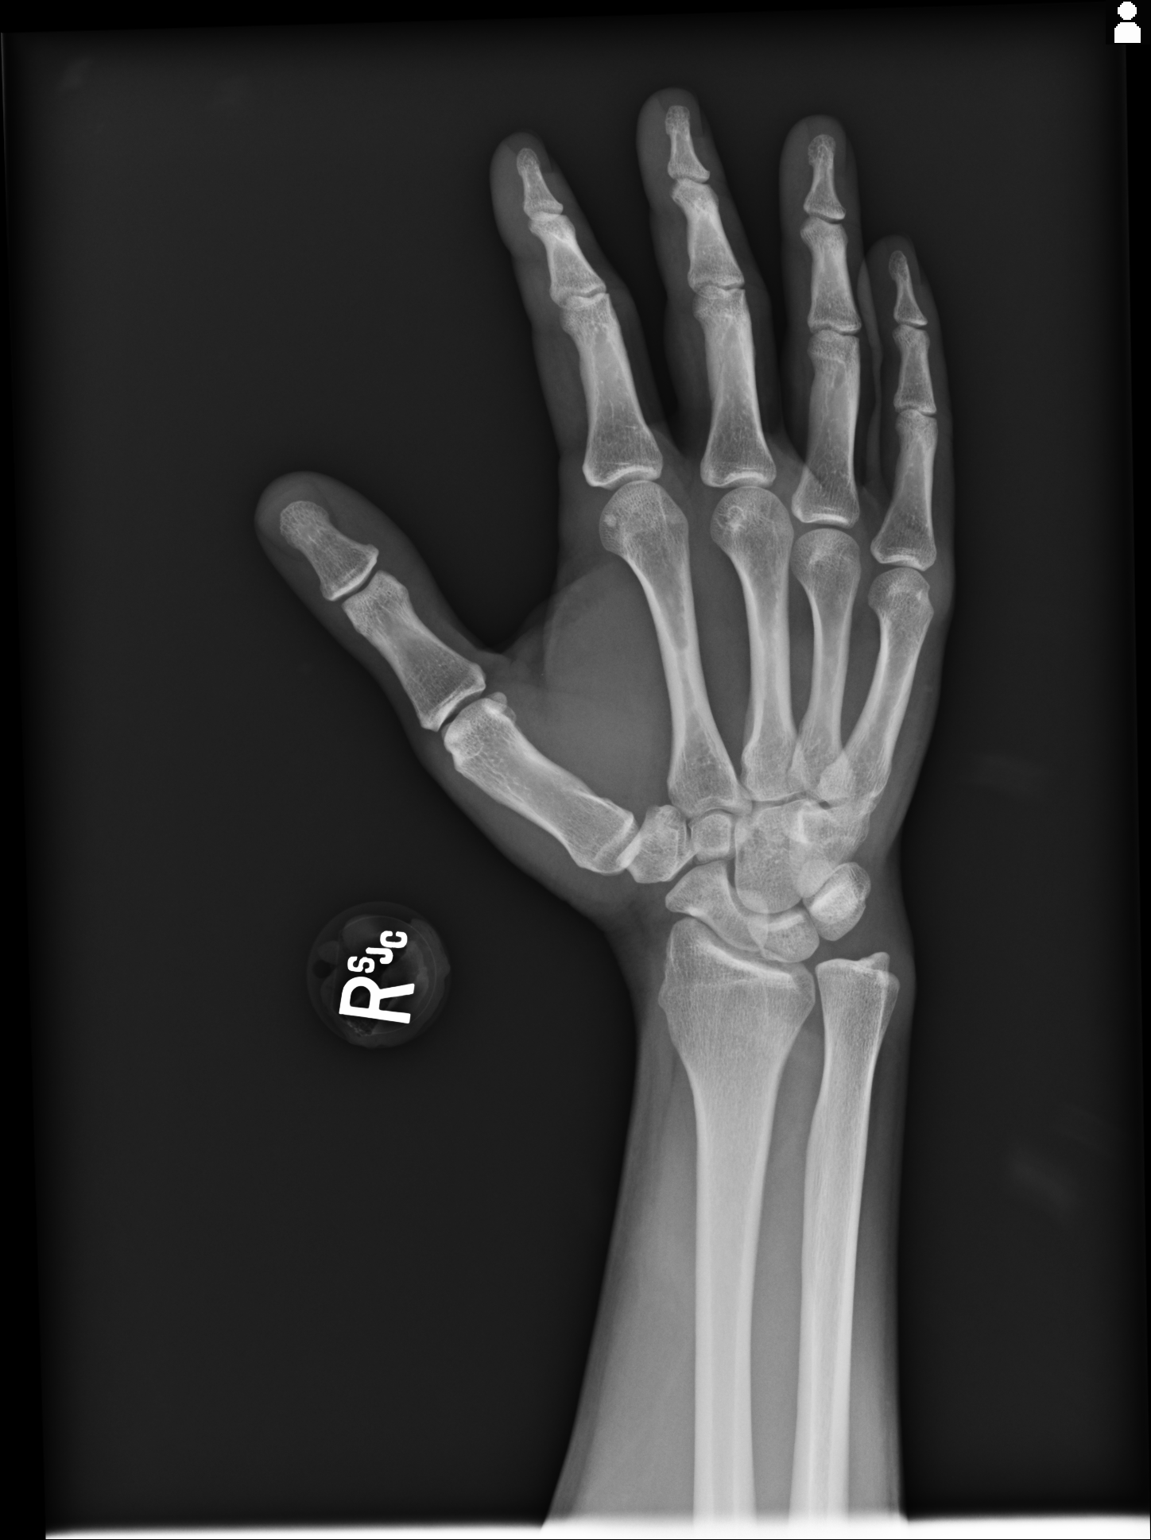
[im 3/3]
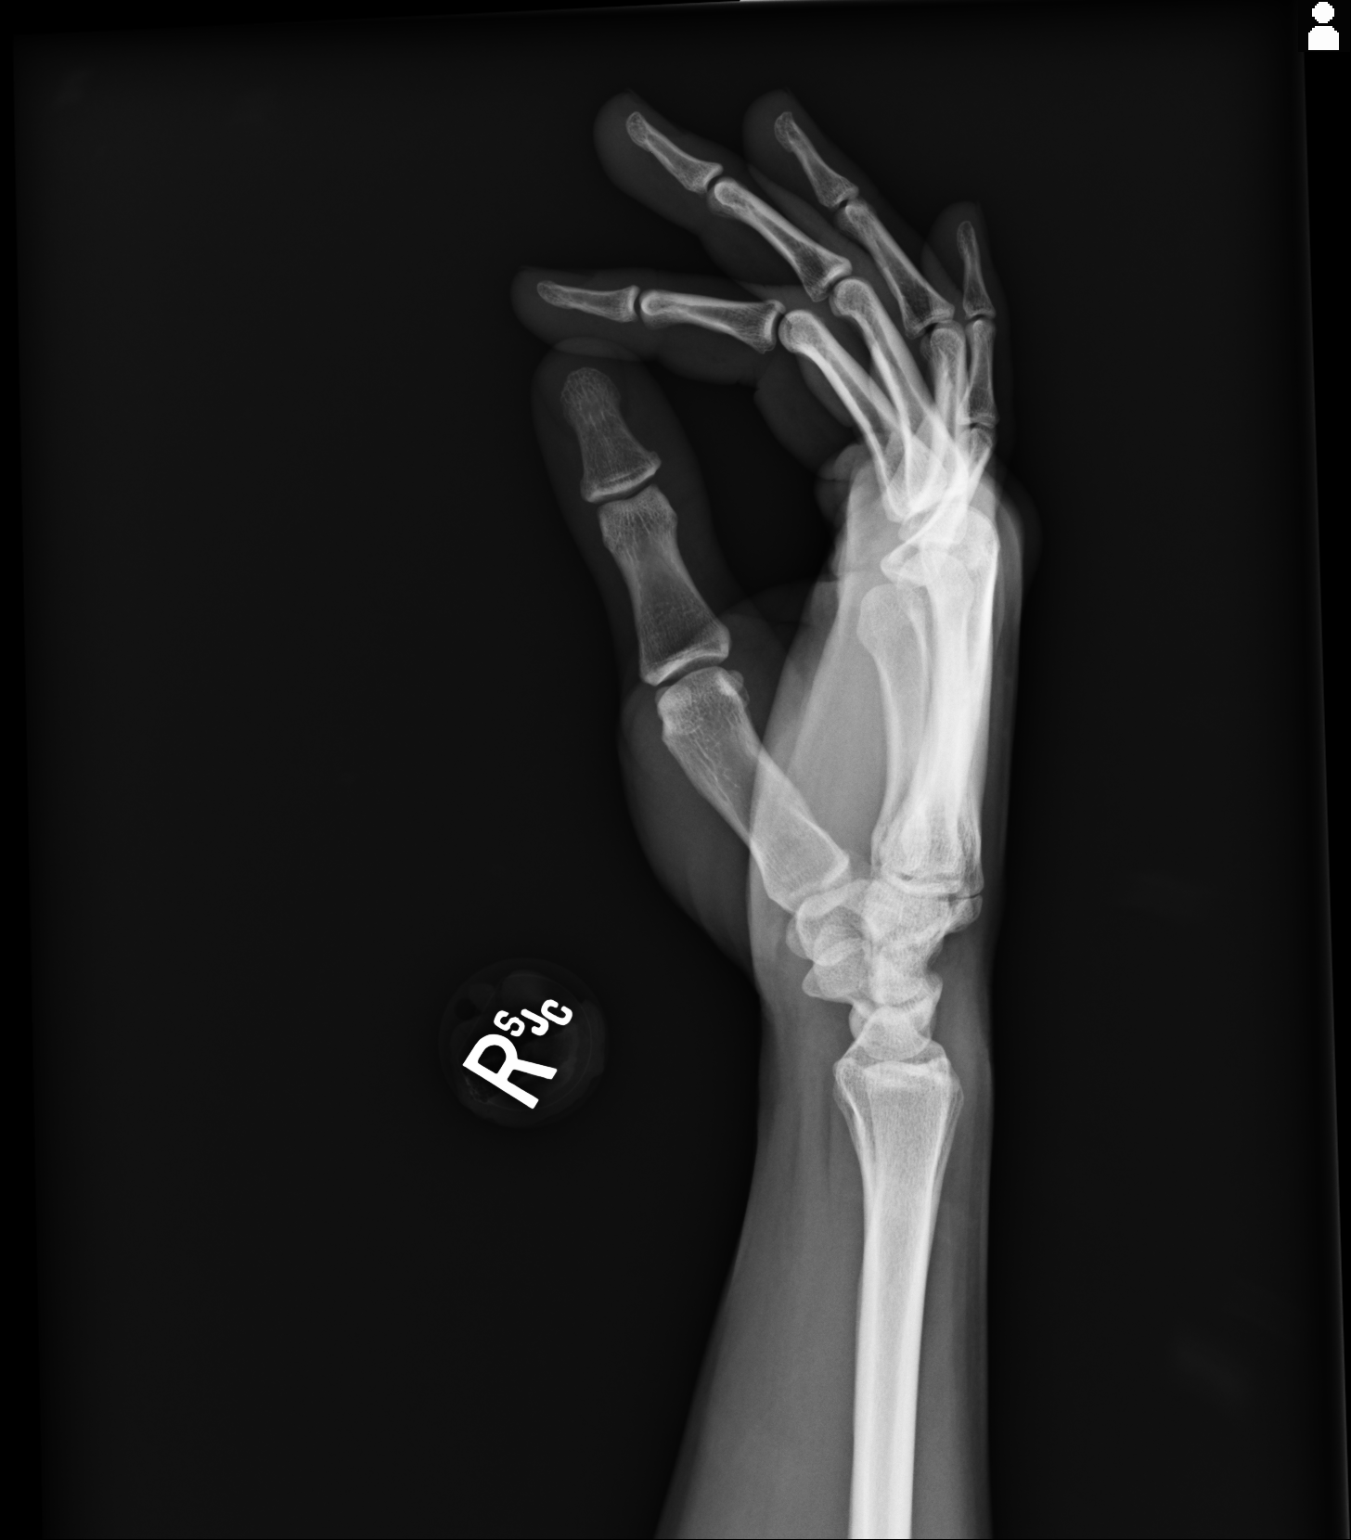

[3 of 3 positions shown; findings below may reference images not displayed]

FINDINGS: There is no evidence of fracture or dislocation. The joint spaces
are preserved. The carpal rows are intact, and demonstrate normal
alignment. The soft tissues are unremarkable in appearance.
IMPRESSION: No evidence of fracture or dislocation.

## 2018-01-15 ENCOUNTER — Ambulatory Visit (INDEPENDENT_AMBULATORY_CARE_PROVIDER_SITE_OTHER): Payer: 59 | Admitting: Psychology

## 2018-01-15 DIAGNOSIS — F411 Generalized anxiety disorder: Secondary | ICD-10-CM

## 2018-01-29 ENCOUNTER — Ambulatory Visit: Payer: 59 | Admitting: Psychology

## 2018-02-01 ENCOUNTER — Ambulatory Visit (INDEPENDENT_AMBULATORY_CARE_PROVIDER_SITE_OTHER): Payer: 59 | Admitting: Psychology

## 2018-02-01 DIAGNOSIS — F332 Major depressive disorder, recurrent severe without psychotic features: Secondary | ICD-10-CM

## 2018-02-15 ENCOUNTER — Ambulatory Visit: Payer: Self-pay | Admitting: Psychology

## 2019-07-11 ENCOUNTER — Encounter: Payer: Self-pay | Admitting: Dermatology
